# Patient Record
Sex: Female | Born: 1950 | Race: White | Hispanic: No | Marital: Married | Smoking: Never smoker
Health system: Southern US, Community
[De-identification: ages and names within clinical notes are randomized; demographics above are authoritative.]

## PROBLEM LIST (undated history)

## (undated) DIAGNOSIS — G629 Polyneuropathy, unspecified: Secondary | ICD-10-CM

## (undated) DIAGNOSIS — Z9221 Personal history of antineoplastic chemotherapy: Secondary | ICD-10-CM

## (undated) DIAGNOSIS — I071 Rheumatic tricuspid insufficiency: Secondary | ICD-10-CM

## (undated) DIAGNOSIS — M1990 Unspecified osteoarthritis, unspecified site: Secondary | ICD-10-CM

## (undated) DIAGNOSIS — F0390 Unspecified dementia without behavioral disturbance: Secondary | ICD-10-CM

## (undated) DIAGNOSIS — G2581 Restless legs syndrome: Secondary | ICD-10-CM

## (undated) DIAGNOSIS — J45909 Unspecified asthma, uncomplicated: Secondary | ICD-10-CM

## (undated) DIAGNOSIS — I340 Nonrheumatic mitral (valve) insufficiency: Secondary | ICD-10-CM

## (undated) DIAGNOSIS — E785 Hyperlipidemia, unspecified: Secondary | ICD-10-CM

## (undated) DIAGNOSIS — I10 Essential (primary) hypertension: Secondary | ICD-10-CM

## (undated) DIAGNOSIS — M5136 Other intervertebral disc degeneration, lumbar region: Secondary | ICD-10-CM

## (undated) DIAGNOSIS — C189 Malignant neoplasm of colon, unspecified: Secondary | ICD-10-CM

## (undated) HISTORY — PX: HAND SURGERY: SHX662

## (undated) HISTORY — PX: ABDOMINAL HYSTERECTOMY: SHX81

## (undated) HISTORY — PX: ROTATOR CUFF REPAIR: SHX139

## (undated) HISTORY — PX: HAMMER TOE SURGERY: SHX385

## (undated) HISTORY — PX: COLON RESECTION: SHX5231

## (undated) HISTORY — DX: Hyperlipidemia, unspecified: E78.5

## (undated) HISTORY — PX: BREAST CYST ASPIRATION: SHX578

## (undated) HISTORY — DX: Essential (primary) hypertension: I10

---

## 2004-04-05 ENCOUNTER — Ambulatory Visit: Payer: Self-pay | Admitting: Obstetrics and Gynecology

## 2005-02-21 ENCOUNTER — Ambulatory Visit: Payer: Self-pay | Admitting: Obstetrics and Gynecology

## 2006-03-06 ENCOUNTER — Ambulatory Visit: Payer: Self-pay | Admitting: Obstetrics and Gynecology

## 2006-09-19 ENCOUNTER — Encounter: Payer: Self-pay | Admitting: General Practice

## 2006-09-29 ENCOUNTER — Encounter: Payer: Self-pay | Admitting: General Practice

## 2006-10-29 ENCOUNTER — Encounter: Payer: Self-pay | Admitting: General Practice

## 2007-03-25 ENCOUNTER — Ambulatory Visit: Payer: Self-pay | Admitting: Obstetrics and Gynecology

## 2008-04-04 ENCOUNTER — Ambulatory Visit: Payer: Self-pay | Admitting: Obstetrics and Gynecology

## 2009-04-06 ENCOUNTER — Ambulatory Visit: Payer: Self-pay | Admitting: Obstetrics and Gynecology

## 2009-08-14 ENCOUNTER — Ambulatory Visit: Payer: Self-pay | Admitting: General Practice

## 2009-08-18 ENCOUNTER — Ambulatory Visit: Payer: Self-pay | Admitting: General Practice

## 2009-10-16 ENCOUNTER — Encounter: Payer: Self-pay | Admitting: General Practice

## 2009-10-28 ENCOUNTER — Encounter: Payer: Self-pay | Admitting: General Practice

## 2010-04-26 ENCOUNTER — Ambulatory Visit: Payer: Self-pay | Admitting: Obstetrics and Gynecology

## 2011-03-27 ENCOUNTER — Emergency Department: Payer: Self-pay

## 2011-03-27 LAB — CBC
MCH: 30.4 pg (ref 26.0–34.0)
MCHC: 33.2 g/dL (ref 32.0–36.0)
MCV: 92 fL (ref 80–100)
Platelet: 315 10*3/uL (ref 150–440)
RBC: 3.81 10*6/uL (ref 3.80–5.20)
RDW: 13.9 % (ref 11.5–14.5)

## 2011-03-27 LAB — BASIC METABOLIC PANEL
Anion Gap: 10 (ref 7–16)
BUN: 21 mg/dL — ABNORMAL HIGH (ref 7–18)
Chloride: 105 mmol/L (ref 98–107)
Co2: 28 mmol/L (ref 21–32)
Creatinine: 0.88 mg/dL (ref 0.60–1.30)
EGFR (African American): >60
EGFR (Non-African Amer.): >60
Sodium: 143 mmol/L (ref 136–145)

## 2011-03-27 LAB — TROPONIN I: Troponin-I: <0.02 ng/mL

## 2011-05-01 ENCOUNTER — Ambulatory Visit: Payer: Self-pay | Admitting: Obstetrics and Gynecology

## 2012-05-05 ENCOUNTER — Ambulatory Visit: Payer: Self-pay | Admitting: Obstetrics and Gynecology

## 2013-01-28 DIAGNOSIS — C189 Malignant neoplasm of colon, unspecified: Secondary | ICD-10-CM

## 2013-01-28 HISTORY — DX: Malignant neoplasm of colon, unspecified: C18.9

## 2013-02-28 ENCOUNTER — Ambulatory Visit: Payer: Self-pay | Admitting: Internal Medicine

## 2013-03-10 ENCOUNTER — Inpatient Hospital Stay: Payer: Self-pay | Admitting: Internal Medicine

## 2013-03-10 LAB — CBC
HCT: 26.4 % — ABNORMAL LOW (ref 35.0–47.0)
HGB: 8.5 g/dL — AB (ref 12.0–16.0)
MCH: 27.5 pg (ref 26.0–34.0)
MCHC: 32 g/dL (ref 32.0–36.0)
MCV: 86 fL (ref 80–100)
Platelet: 455 10*3/uL — ABNORMAL HIGH (ref 150–440)
RBC: 3.08 10*6/uL — AB (ref 3.80–5.20)
RDW: 14.7 % — AB (ref 11.5–14.5)
WBC: 12.5 10*3/uL — ABNORMAL HIGH (ref 3.6–11.0)

## 2013-03-10 LAB — TROPONIN I
Troponin-I: <0.02 ng/mL
Troponin-I: <0.02 ng/mL

## 2013-03-10 LAB — COMPREHENSIVE METABOLIC PANEL
ALK PHOS: 41 U/L — AB
Albumin: 2.8 g/dL — ABNORMAL LOW (ref 3.4–5.0)
Anion Gap: 4 — ABNORMAL LOW (ref 7–16)
BUN: 15 mg/dL (ref 7–18)
Bilirubin,Total: 0.3 mg/dL (ref 0.2–1.0)
CALCIUM: 8.8 mg/dL (ref 8.5–10.1)
CHLORIDE: 103 mmol/L (ref 98–107)
CO2: 27 mmol/L (ref 21–32)
Creatinine: 0.76 mg/dL (ref 0.60–1.30)
EGFR (African American): >60
Glucose: 113 mg/dL — ABNORMAL HIGH (ref 65–99)
OSMOLALITY: 270 (ref 275–301)
Potassium: 3.4 mmol/L — ABNORMAL LOW (ref 3.5–5.1)
SGOT(AST): 46 U/L — ABNORMAL HIGH (ref 15–37)
SGPT (ALT): 12 U/L (ref 12–78)
SODIUM: 134 mmol/L — AB (ref 136–145)
Total Protein: 7 g/dL (ref 6.4–8.2)

## 2013-03-10 LAB — IRON AND TIBC
IRON BIND. CAP.(TOTAL): 406 ug/dL (ref 250–450)
IRON SATURATION: 5 %
Iron: 20 ug/dL — ABNORMAL LOW (ref 50–170)
Unbound Iron-Bind.Cap.: 386 ug/dL

## 2013-03-10 LAB — RETICULOCYTES
Absolute Retic Count: 0.0791 10*6/uL (ref 0.019–0.186)
Reticulocyte: 2.58 % (ref 0.4–3.1)

## 2013-03-10 LAB — CK: CK, TOTAL: 116 U/L

## 2013-03-10 LAB — FERRITIN: Ferritin (ARMC): 18 ng/mL (ref 8–388)

## 2013-03-11 LAB — CBC WITH DIFFERENTIAL/PLATELET
BASOS PCT: 0.4 %
Basophil #: 0 10*3/uL (ref 0.0–0.1)
EOS ABS: 0.1 10*3/uL (ref 0.0–0.7)
EOS PCT: 1.5 %
HCT: 21.5 % — ABNORMAL LOW (ref 35.0–47.0)
HGB: 6.9 g/dL — ABNORMAL LOW (ref 12.0–16.0)
Lymphocyte #: 0.8 10*3/uL — ABNORMAL LOW (ref 1.0–3.6)
Lymphocyte %: 9.7 %
MCH: 27.4 pg (ref 26.0–34.0)
MCHC: 32.2 g/dL (ref 32.0–36.0)
MCV: 85 fL (ref 80–100)
MONO ABS: 0.6 x10 3/mm (ref 0.2–0.9)
MONOS PCT: 6.6 %
Neutrophil #: 6.8 10*3/uL — ABNORMAL HIGH (ref 1.4–6.5)
Neutrophil %: 81.8 %
Platelet: 395 10*3/uL (ref 150–440)
RBC: 2.52 10*6/uL — ABNORMAL LOW (ref 3.80–5.20)
RDW: 14.6 % — AB (ref 11.5–14.5)
WBC: 8.4 10*3/uL (ref 3.6–11.0)

## 2013-03-11 LAB — DRUG SCREEN, URINE
Amphetamines, Ur Screen: NEGATIVE (ref ?–1000)
BARBITURATES, UR SCREEN: NEGATIVE (ref ?–200)
Benzodiazepine, Ur Scrn: NEGATIVE (ref ?–200)
Cannabinoid 50 Ng, Ur ~~LOC~~: NEGATIVE (ref ?–50)
Cocaine Metabolite,Ur ~~LOC~~: NEGATIVE (ref ?–300)
MDMA (Ecstasy)Ur Screen: NEGATIVE (ref ?–500)
METHADONE, UR SCREEN: NEGATIVE (ref ?–300)
Opiate, Ur Screen: POSITIVE (ref ?–300)
Phencyclidine (PCP) Ur S: NEGATIVE (ref ?–25)
Tricyclic, Ur Screen: NEGATIVE (ref ?–1000)

## 2013-03-11 LAB — URINALYSIS, COMPLETE
BLOOD: NEGATIVE
Bilirubin,UR: NEGATIVE
Glucose,UR: NEGATIVE mg/dL (ref 0–75)
Ketone: NEGATIVE
NITRITE: NEGATIVE
PROTEIN: NEGATIVE
Ph: 6 (ref 4.5–8.0)
RBC,UR: 2 /HPF (ref 0–5)
Specific Gravity: 1.006 (ref 1.003–1.030)
Squamous Epithelial: 3
Transitional Epi: 1
WBC UR: 27 /HPF (ref 0–5)

## 2013-03-11 LAB — BASIC METABOLIC PANEL
Anion Gap: 5 — ABNORMAL LOW (ref 7–16)
BUN: 11 mg/dL (ref 7–18)
CHLORIDE: 102 mmol/L (ref 98–107)
CO2: 27 mmol/L (ref 21–32)
CREATININE: 0.68 mg/dL (ref 0.60–1.30)
Calcium, Total: 8.2 mg/dL — ABNORMAL LOW (ref 8.5–10.1)
EGFR (African American): >60
Glucose: 88 mg/dL (ref 65–99)
Osmolality: 267 (ref 275–301)
Potassium: 3.6 mmol/L (ref 3.5–5.1)
Sodium: 134 mmol/L — ABNORMAL LOW (ref 136–145)

## 2013-03-11 LAB — HEMOGLOBIN
HGB: 7.2 g/dL — ABNORMAL LOW (ref 12.0–16.0)
HGB: 8.3 g/dL — AB (ref 12.0–16.0)

## 2013-03-11 LAB — LIPID PANEL
Cholesterol: 95 mg/dL (ref 0–200)
HDL Cholesterol: 29 mg/dL — ABNORMAL LOW (ref 40–60)
LDL CHOLESTEROL, CALC: 48 mg/dL (ref 0–100)
Triglycerides: 91 mg/dL (ref 0–200)
VLDL Cholesterol, Calc: 18 mg/dL (ref 5–40)

## 2013-03-11 LAB — CA 125: CA 125: 80.9 U/mL — ABNORMAL HIGH (ref 0.0–34.0)

## 2013-03-12 LAB — HEMOGLOBIN
HGB: 8.4 g/dL — ABNORMAL LOW (ref 12.0–16.0)
HGB: 8.2 g/dL — AB (ref 12.0–16.0)

## 2013-03-12 LAB — CEA: CEA: 141.6 ng/mL — AB (ref 0.0–4.7)

## 2013-03-13 DIAGNOSIS — M1990 Unspecified osteoarthritis, unspecified site: Secondary | ICD-10-CM | POA: Insufficient documentation

## 2013-03-28 ENCOUNTER — Ambulatory Visit: Payer: Self-pay | Admitting: Internal Medicine

## 2013-04-09 DIAGNOSIS — D649 Anemia, unspecified: Secondary | ICD-10-CM | POA: Insufficient documentation

## 2013-04-09 DIAGNOSIS — C189 Malignant neoplasm of colon, unspecified: Secondary | ICD-10-CM | POA: Insufficient documentation

## 2013-04-09 DIAGNOSIS — K219 Gastro-esophageal reflux disease without esophagitis: Secondary | ICD-10-CM | POA: Insufficient documentation

## 2013-04-09 DIAGNOSIS — G610 Guillain-Barre syndrome: Secondary | ICD-10-CM | POA: Insufficient documentation

## 2013-04-09 DIAGNOSIS — M5136 Other intervertebral disc degeneration, lumbar region: Secondary | ICD-10-CM | POA: Insufficient documentation

## 2013-04-09 DIAGNOSIS — M51369 Other intervertebral disc degeneration, lumbar region without mention of lumbar back pain or lower extremity pain: Secondary | ICD-10-CM | POA: Insufficient documentation

## 2014-04-21 ENCOUNTER — Ambulatory Visit: Payer: Self-pay | Admitting: Family Medicine

## 2014-04-21 LAB — LIPID PANEL
Cholesterol: 170 mg/dL (ref 0–200)
HDL: 63 mg/dL (ref 35–70)
LDL CALC: 75 mg/dL
Triglycerides: 161 mg/dL — AB (ref 40–160)

## 2014-04-21 LAB — BASIC METABOLIC PANEL
BUN: 17 mg/dL (ref 4–21)
Creatinine: 0.6 mg/dL (ref <=1.1)
GLUCOSE: 89 mg/dL
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
## 2014-05-21 NOTE — Consult Note (Signed)
Brief Consult Note: Diagnosis: pelvic mass, likely gyn origin, possible sarcoma, iron deficient, possible colon involvement.   Patient was seen by consultant.   Discussed with Attending MD.   Comments: Patient seen, chart reviewed, discused with medicine and GYN ONCOLOGY. Appreciate surgery and GI note. NO PARACENTESIS. iNITIAL TX WILL BE SURGICAL.  Plan to watch serial hgb.. Plan best management will be to transfer to medical center, [place], WILL LOOK AT [place].   NOTE TOOK NSAID.Marland KitchenMELOXICAM, LAST DOSE ON 2/11 See dictated note....  Electronic Signatures: Dallas Schimke (MD)  (Signed 12-Feb-15 19:11)  Authored: Brief Consult Note   Last Updated: 12-Feb-15 19:11 by Dallas Schimke (MD)

## 2014-05-21 NOTE — Consult Note (Signed)
Pt seen and examined. Please see Dawn Harrrison's notes. Pt with large pelvic mass with ascites. Possible hemoperitoneum. Dropping hgb. Pt does have heme positive stool. Oncology/surgery/gyn oncology involved.  Would prefer NOT to do any colonoscopy at this point until patient goes to surgery due to increased risk of complications. However, willing to do colonoscopy to determine tumor involvement of colon from pelvic mass ONLY IF gyn oncology strongly feels it will benefit. Will follow. Thanks.  Electronic Signatures: Verdie Shire (MD)  (Signed on 12-Feb-15 14:59)  Authored  Last Updated: 12-Feb-15 14:59 by Verdie Shire (MD)
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
## 2014-05-21 NOTE — Discharge Summary (Signed)
PATIENT NAME:  Kristin Mata, SLOMSKI MR#:  [id number] DATE OF BIRTH:  07/28/1950  DATE OF ADMISSION:  03/10/2013 DATE OF DISCHARGE:  03/12/2013  The patient's discharge summary has already been done by Dr. Darvin Neighbours on the 13th of February.    ____________________________ Hart Rochester Posey Pronto, MD sap:gb D: 03/18/2013 16:57:12 ET T: 03/18/2013 23:12:47 ET JOB#: [id number]  cc: Emberlynn Riggan A. Posey Pronto, MD, <Dictator> Ilda Basset MD ELECTRONICALLY SIGNED 03/28/2013 13:36
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
## 2014-05-21 NOTE — Consult Note (Signed)
Brief Consult Note: Diagnosis: pelvic mass.   Patient was seen by consultant.   Consult note dictated.   Discussed with Attending MD.   Comments: Suspect malig from rt tube or ovary, prob sarcoma. Abuts abd wall. Will await Dr B Miller's input but may benefit from tert center referral. Will let Dr Sabra Heck make decision after she reviews films. Doubt significant hemoperitoneum to account for hgb of 6.9. prob chronic, has been ill since November, not last week when opain worsened.  Electronic Signatures: Florene Glen (MD)  (Signed 12-Feb-15 15:26)  Authored: Brief Consult Note   Last Updated: 12-Feb-15 15:26 by Florene Glen (MD)
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
## 2014-06-07 DIAGNOSIS — E7849 Other hyperlipidemia: Secondary | ICD-10-CM | POA: Insufficient documentation

## 2014-06-07 DIAGNOSIS — J3081 Allergic rhinitis due to animal (cat) (dog) hair and dander: Secondary | ICD-10-CM | POA: Insufficient documentation

## 2014-06-07 DIAGNOSIS — J4520 Mild intermittent asthma, uncomplicated: Secondary | ICD-10-CM | POA: Insufficient documentation

## 2014-06-07 DIAGNOSIS — Z8679 Personal history of other diseases of the circulatory system: Secondary | ICD-10-CM | POA: Insufficient documentation

## 2014-08-18 ENCOUNTER — Other Ambulatory Visit: Payer: Self-pay | Admitting: Obstetrics and Gynecology

## 2014-08-18 DIAGNOSIS — Z1231 Encounter for screening mammogram for malignant neoplasm of breast: Secondary | ICD-10-CM

## 2014-08-23 ENCOUNTER — Ambulatory Visit
Admission: RE | Admit: 2014-08-23 | Discharge: 2014-08-23 | Disposition: A | Discharge status: Home / Self Care | Payer: BC Managed Care – PPO | Source: Ambulatory Visit | Attending: Obstetrics and Gynecology | Admitting: Obstetrics and Gynecology

## 2014-08-23 DIAGNOSIS — Z1231 Encounter for screening mammogram for malignant neoplasm of breast: Secondary | ICD-10-CM | POA: Diagnosis not present

## 2014-08-23 HISTORY — DX: Malignant neoplasm of colon, unspecified: C18.9

## 2014-12-03 ENCOUNTER — Other Ambulatory Visit: Payer: Self-pay | Admitting: Family Medicine

## 2014-12-14 ENCOUNTER — Other Ambulatory Visit: Payer: Self-pay | Admitting: Family Medicine

## 2014-12-15 ENCOUNTER — Encounter: Payer: Self-pay | Admitting: Family Medicine

## 2014-12-15 ENCOUNTER — Ambulatory Visit (INDEPENDENT_AMBULATORY_CARE_PROVIDER_SITE_OTHER): Payer: BC Managed Care – PPO | Admitting: Family Medicine

## 2014-12-15 VITALS — BP 130/86 | HR 64 | Ht 60.0 in | Wt 137.0 lb

## 2014-12-15 DIAGNOSIS — E784 Other hyperlipidemia: Secondary | ICD-10-CM | POA: Diagnosis not present

## 2014-12-15 DIAGNOSIS — I10 Essential (primary) hypertension: Secondary | ICD-10-CM

## 2014-12-15 DIAGNOSIS — E7849 Other hyperlipidemia: Secondary | ICD-10-CM

## 2014-12-15 DIAGNOSIS — M1990 Unspecified osteoarthritis, unspecified site: Secondary | ICD-10-CM | POA: Diagnosis not present

## 2014-12-15 MED ORDER — MELOXICAM 15 MG PO TABS: ORAL_TABLET | ORAL | Status: DC

## 2014-12-15 MED ORDER — SIMVASTATIN 20 MG PO TABS: 20.0000 mg | ORAL_TABLET | Freq: Every day | ORAL | Status: DC

## 2014-12-15 MED ORDER — FLUTICASONE PROPIONATE 50 MCG/ACT NA SUSP: 2.0000 | Freq: Every day | NASAL | Status: DC

## 2014-12-15 MED ORDER — LOSARTAN POTASSIUM-HCTZ 100-25 MG PO TABS: 1.0000 | ORAL_TABLET | Freq: Every day | ORAL | Status: DC

## 2014-12-15 NOTE — Progress Notes (Signed)
Name: Kristin Mata   MRN: [id number]    DOB: 1950/05/16   Date:12/15/2014       Progress Note  Subjective  Chief Complaint  Chief Complaint  Patient presents with  . Allergic Rhinitis   . Hypertension  . Hyperlipidemia  . Arthritis    takes Meloxicam daily for arthritis relief    Hypertension This is a chronic problem. The current episode started more than 1 year ago. The problem has been gradually improving since onset. The problem is controlled. Pertinent negatives include no anxiety, blurred vision, chest pain, headaches, malaise/fatigue, neck pain, orthopnea, palpitations, peripheral edema, PND, shortness of breath or sweats. There are no associated agents to hypertension. Risk factors for coronary artery disease include dyslipidemia. Past treatments include angiotensin blockers and diuretics. The current treatment provides moderate improvement. There are no compliance problems.  There is no history of angina, kidney disease, CAD/MI, CVA, heart failure, left ventricular hypertrophy, PVD or renovascular disease. There is no history of chronic renal disease or a hypertension causing med.  Hyperlipidemia This is a chronic problem. The current episode started more than 1 year ago. The problem is controlled. Recent lipid tests were reviewed and are normal. She has no history of chronic renal disease, diabetes, hypothyroidism, liver disease, obesity or nephrotic syndrome. There are no known factors aggravating her hyperlipidemia. Pertinent negatives include no chest pain, focal sensory loss, focal weakness, leg pain, myalgias or shortness of breath. Current antihyperlipidemic treatment includes statins. The current treatment provides mild improvement of lipids. There are no compliance problems.  Risk factors for coronary artery disease include hypertension and dyslipidemia.  Arthritis Presents for follow-up visit. She complains of pain. She reports no stiffness, joint swelling or joint  warmth. Affected location: bilateral hands. Pertinent negatives include no diarrhea, dry eyes, dry mouth, dysuria, fatigue, fever, pain at night, pain while resting, rash, Raynaud's syndrome, uveitis or weight loss. Her past medical history is significant for osteoarthritis. There is no history of chronic back pain or lupus.  Past treatments include nothing. The treatment provided no relief.    No problem-specific assessment & plan notes found for this encounter.   Past Medical History  Diagnosis Date  . Colon cancer ([place]) 2015    had chemo    Past Surgical History  Procedure Laterality Date  . Hand surgery    . Breast cyst aspiration Bilateral     neg    Family History  Problem Relation Age of Onset  . Breast cancer Sister 61    Social History   Social History  . Marital Status: Married    Spouse Name: N/A  . Number of Children: N/A  . Years of Education: N/A   Occupational History  . Not on file.   Social History Main Topics  . Smoking status: Never Smoker   . Smokeless tobacco: Not on file  . Alcohol Use: 0.0 oz/week    0 Standard drinks or equivalent per week  . Drug Use: No  . Sexual Activity: Not Currently   Other Topics Concern  . Not on file   Social History Narrative    No Known Allergies   Review of Systems  Constitutional: Negative for fever, chills, weight loss, malaise/fatigue and fatigue.  HENT: Negative for ear discharge, ear pain and sore throat.   Eyes: Negative for blurred vision.  Respiratory: Negative for cough, sputum production, shortness of breath and wheezing.   Cardiovascular: Negative for chest pain, palpitations, orthopnea, leg swelling and PND.  Gastrointestinal:  Negative for heartburn, nausea, abdominal pain, diarrhea, constipation, blood in stool and melena.  Genitourinary: Negative for dysuria, urgency, frequency and hematuria.  Musculoskeletal: Positive for arthritis. Negative for myalgias, back pain, joint pain, joint  swelling, stiffness and neck pain.  Skin: Negative for rash.  Neurological: Negative for dizziness, tingling, sensory change, focal weakness and headaches.  Endo/Heme/Allergies: Negative for environmental allergies and polydipsia. Does not bruise/bleed easily.  Psychiatric/Behavioral: Negative for depression and suicidal ideas. The patient is not nervous/anxious and does not have insomnia.      Objective  Filed Vitals:   12/15/14 1459  BP: 130/86  Pulse: 64  Height: 5' (1.524 m)  Weight: 137 lb (62.143 kg)    Physical Exam  Constitutional: She is well-developed, well-nourished, and in no distress. No distress.  HENT:  Head: Normocephalic and atraumatic.  Right Ear: External ear normal.  Left Ear: External ear normal.  Nose: Nose normal.  Mouth/Throat: Oropharynx is clear and moist.  Eyes: Conjunctivae and EOM are normal. Pupils are equal, round, and reactive to light. Right eye exhibits no discharge. Left eye exhibits no discharge.  Neck: Normal range of motion. Neck supple. No JVD present. No thyromegaly present.  Cardiovascular: Normal rate, regular rhythm, normal heart sounds and intact distal pulses.  Exam reveals no gallop and no friction rub.   No murmur heard. Pulmonary/Chest: Effort normal and breath sounds normal.  Abdominal: Soft. Bowel sounds are normal. She exhibits no mass. There is no tenderness. There is no guarding.  Musculoskeletal: Normal range of motion. She exhibits no edema.  Lymphadenopathy:    She has no cervical adenopathy.  Neurological: She is alert. She has normal reflexes.  Skin: Skin is warm and dry. She is not diaphoretic.  Psychiatric: Mood and affect normal.  Nursing note and vitals reviewed.     Assessment & Plan  Problem List Items Addressed This Visit      Other   Familial multiple lipoprotein-type hyperlipidemia   Relevant Medications   losartan-hydrochlorothiazide (HYZAAR) 100-25 MG tablet   simvastatin (ZOCOR) 20 MG tablet    Other Relevant Orders   Lipid Profile    Other Visit Diagnoses    Essential hypertension    -  Primary    Relevant Medications    losartan-hydrochlorothiazide (HYZAAR) 100-25 MG tablet    simvastatin (ZOCOR) 20 MG tablet    Other Relevant Orders    Renal Function Panel    Osteoarthritis, unspecified osteoarthritis type, unspecified site        Relevant Medications    meloxicam (MOBIC) 15 MG tablet         Dr. Dierks [facility]  12/15/2014

## 2014-12-16 LAB — RENAL FUNCTION PANEL
Albumin: 4.3 g/dL (ref 3.6–4.8)
BUN / CREAT RATIO: 24 (ref 11–26)
BUN: 16 mg/dL (ref 8–27)
CO2: 29 mmol/L (ref 18–29)
CREATININE: 0.68 mg/dL (ref 0.57–1.00)
Calcium: 9.5 mg/dL (ref 8.7–10.3)
Chloride: 99 mmol/L (ref 97–106)
GFR, EST AFRICAN AMERICAN: 108 mL/min/{1.73_m2} (ref >59)
GFR, EST NON AFRICAN AMERICAN: 94 mL/min/{1.73_m2} (ref >59)
GLUCOSE: 80 mg/dL (ref 65–99)
PHOSPHORUS: 3.6 mg/dL (ref 2.5–4.5)
POTASSIUM: 3.7 mmol/L (ref 3.5–5.2)
SODIUM: 141 mmol/L (ref 136–144)

## 2014-12-16 LAB — LIPID PANEL
CHOL/HDL RATIO: 2.9 ratio (ref 0.0–4.4)
Cholesterol, Total: 170 mg/dL (ref 100–199)
HDL: 58 mg/dL (ref >39)
LDL CALC: 77 mg/dL (ref 0–99)
TRIGLYCERIDES: 175 mg/dL — AB (ref 0–149)
VLDL Cholesterol Cal: 35 mg/dL (ref 5–40)

## 2014-12-25 ENCOUNTER — Other Ambulatory Visit: Payer: Self-pay | Admitting: Family Medicine

## 2015-01-02 ENCOUNTER — Other Ambulatory Visit: Payer: Self-pay | Admitting: Family Medicine

## 2015-06-09 ENCOUNTER — Encounter: Payer: Self-pay | Admitting: Family Medicine

## 2015-06-09 ENCOUNTER — Ambulatory Visit (INDEPENDENT_AMBULATORY_CARE_PROVIDER_SITE_OTHER): Payer: BC Managed Care – PPO | Admitting: Family Medicine

## 2015-06-09 VITALS — BP 138/80 | HR 60 | Ht 60.0 in | Wt 146.0 lb

## 2015-06-09 DIAGNOSIS — J4520 Mild intermittent asthma, uncomplicated: Secondary | ICD-10-CM

## 2015-06-09 DIAGNOSIS — Z8679 Personal history of other diseases of the circulatory system: Secondary | ICD-10-CM | POA: Diagnosis not present

## 2015-06-09 DIAGNOSIS — E7849 Other hyperlipidemia: Secondary | ICD-10-CM

## 2015-06-09 DIAGNOSIS — M1990 Unspecified osteoarthritis, unspecified site: Secondary | ICD-10-CM | POA: Diagnosis not present

## 2015-06-09 DIAGNOSIS — E784 Other hyperlipidemia: Secondary | ICD-10-CM

## 2015-06-09 MED ORDER — SIMVASTATIN 20 MG PO TABS: 20.0000 mg | ORAL_TABLET | Freq: Every day | ORAL | Status: DC

## 2015-06-09 MED ORDER — FLUTICASONE-SALMETEROL 250-50 MCG/DOSE IN AEPB: 1.0000 | INHALATION_SPRAY | Freq: Every day | RESPIRATORY_TRACT | Status: DC

## 2015-06-09 MED ORDER — MELOXICAM 15 MG PO TABS: ORAL_TABLET | ORAL | Status: DC

## 2015-06-09 MED ORDER — LOSARTAN POTASSIUM-HCTZ 100-25 MG PO TABS: 1.0000 | ORAL_TABLET | Freq: Every day | ORAL | Status: DC

## 2015-06-09 NOTE — Progress Notes (Signed)
Name: Kristin Mata   MRN: [id number]    DOB: [birth year]   Date:06/09/2015       Progress Note  Subjective  Chief Complaint  Chief Complaint  Patient presents with  . Hypertension  . Hyperlipidemia  . Asthma  . Arthritis    Hypertension This is a chronic problem. The current episode started more than 1 year ago. The problem has been gradually improving since onset. The problem is controlled. Pertinent negatives include no anxiety, blurred vision, chest pain, headaches, malaise/fatigue, neck pain, orthopnea, palpitations, peripheral edema, PND, shortness of breath or sweats. There are no associated agents to hypertension. Risk factors for coronary artery disease include dyslipidemia. Past treatments include angiotensin blockers and diuretics. The current treatment provides moderate improvement. There are no compliance problems.  There is no history of angina, kidney disease, CAD/MI, CVA, heart failure, left ventricular hypertrophy, PVD, renovascular disease or retinopathy. There is no history of chronic renal disease or a hypertension causing med.  Hyperlipidemia This is a chronic problem. The current episode started more than 1 year ago. The problem is controlled. Recent lipid tests were reviewed and are normal. She has no history of chronic renal disease, diabetes, hypothyroidism, liver disease, obesity or nephrotic syndrome. Factors aggravating her hyperlipidemia include thiazides. Pertinent negatives include no chest pain, focal sensory loss, focal weakness, leg pain, myalgias or shortness of breath. Current antihyperlipidemic treatment includes statins. The current treatment provides mild improvement of lipids. There are no compliance problems.  Risk factors for coronary artery disease include dyslipidemia and hypertension.  Asthma There is no chest tightness, cough, difficulty breathing, frequent throat clearing, shortness of breath, sputum production or wheezing. This is a recurrent  problem. The current episode started more than 1 year ago. The problem has been waxing and waning. Pertinent negatives include no chest pain, ear pain, fever, headaches, heartburn, malaise/fatigue, myalgias, PND, sore throat, sweats or weight loss. She reports moderate improvement on treatment. Her symptoms are not alleviated by beta-agonist. Her past medical history is significant for asthma.  Arthritis Presents for follow-up visit. She reports no pain, stiffness, joint swelling or joint warmth. Pertinent negatives include no diarrhea, dysuria, fever, rash or weight loss.    No problem-specific assessment & plan notes found for this encounter.   Past Medical History  Diagnosis Date  . Colon cancer ([place]) 2015    had chemo  . Hypertension   . Hyperlipidemia     Past Surgical History  Procedure Laterality Date  . Hand surgery    . Breast cyst aspiration Bilateral     neg    Family History  Problem Relation Age of Onset  . Breast cancer Sister 13    Social History   Social History  . Marital Status: Married    Spouse Name: N/A  . Number of Children: N/A  . Years of Education: N/A   Occupational History  . Not on file.   Social History Main Topics  . Smoking status: Never Smoker   . Smokeless tobacco: Not on file  . Alcohol Use: 0.0 oz/week    0 Standard drinks or equivalent per week  . Drug Use: No  . Sexual Activity: Not Currently   Other Topics Concern  . Not on file   Social History Narrative    No Known Allergies   Review of Systems  Constitutional: Negative for fever, chills, weight loss and malaise/fatigue.  HENT: Negative for ear discharge, ear pain and sore throat.   Eyes: Negative for blurred  vision.  Respiratory: Negative for cough, sputum production, shortness of breath and wheezing.   Cardiovascular: Negative for chest pain, palpitations, orthopnea, leg swelling and PND.  Gastrointestinal: Negative for heartburn, nausea, abdominal pain, diarrhea,  constipation, blood in stool and melena.  Genitourinary: Negative for dysuria, urgency, frequency and hematuria.  Musculoskeletal: Positive for arthritis. Negative for myalgias, back pain, joint pain, joint swelling, stiffness and neck pain.  Skin: Negative for rash.  Neurological: Negative for dizziness, tingling, sensory change, focal weakness and headaches.  Endo/Heme/Allergies: Negative for environmental allergies and polydipsia. Does not bruise/bleed easily.  Psychiatric/Behavioral: Negative for depression and suicidal ideas. The patient is not nervous/anxious and does not have insomnia.      Objective  Filed Vitals:   06/09/15 0901  BP: 138/80  Pulse: 60  Height: 5' (1.524 m)  Weight: 146 lb (66.225 kg)    Physical Exam  Constitutional: She is well-developed, well-nourished, and in no distress. No distress.  HENT:  Head: Normocephalic and atraumatic.  Right Ear: External ear normal.  Left Ear: External ear normal.  Nose: Nose normal.  Mouth/Throat: Oropharynx is clear and moist.  Eyes: Conjunctivae and EOM are normal. Pupils are equal, round, and reactive to light. Right eye exhibits no discharge. Left eye exhibits no discharge.  Neck: Normal range of motion. Neck supple. No JVD present. No thyromegaly present.  Cardiovascular: Normal rate, regular rhythm, normal heart sounds and intact distal pulses.  Exam reveals no gallop and no friction rub.   No murmur heard. Pulmonary/Chest: Effort normal and breath sounds normal.  Abdominal: Soft. Bowel sounds are normal. She exhibits no mass. There is no tenderness. There is no guarding.  Musculoskeletal: Normal range of motion. She exhibits no edema.  Lymphadenopathy:    She has no cervical adenopathy.  Neurological: She is alert. She has normal reflexes.  Skin: Skin is warm and dry. She is not diaphoretic.  Psychiatric: Mood and affect normal.  Nursing note and vitals reviewed.     Assessment & Plan  Problem List Items  Addressed This Visit      Respiratory   Asthma, mild intermittent   Relevant Medications   Fluticasone-Salmeterol (ADVAIR) 250-50 MCG/DOSE AEPB     Other   Familial multiple lipoprotein-type hyperlipidemia   Relevant Medications   losartan-hydrochlorothiazide (HYZAAR) 100-25 MG tablet   simvastatin (ZOCOR) 20 MG tablet   Other Relevant Orders   Lipid Profile   H/O: HTN (hypertension) - Primary   Relevant Medications   losartan-hydrochlorothiazide (HYZAAR) 100-25 MG tablet   Other Relevant Orders   Renal Function Panel    Other Visit Diagnoses    Arthritis        Relevant Medications    meloxicam (MOBIC) 15 MG tablet         Dr. Deanna Jones [facility]  06/09/2015

## 2015-06-10 LAB — LIPID PANEL
CHOL/HDL RATIO: 2.8 ratio (ref 0.0–4.4)
Cholesterol, Total: 168 mg/dL (ref 100–199)
HDL: 60 mg/dL (ref >39)
LDL Calculated: 86 mg/dL (ref 0–99)
Triglycerides: 109 mg/dL (ref 0–149)
VLDL Cholesterol Cal: 22 mg/dL (ref 5–40)

## 2015-06-10 LAB — RENAL FUNCTION PANEL
Albumin: 4.3 g/dL (ref 3.6–4.8)
BUN / CREAT RATIO: 24 (ref 12–28)
BUN: 15 mg/dL (ref 8–27)
CALCIUM: 9.3 mg/dL (ref 8.7–10.3)
CHLORIDE: 100 mmol/L (ref 96–106)
CO2: 26 mmol/L (ref 18–29)
Creatinine, Ser: 0.62 mg/dL (ref 0.57–1.00)
GFR, EST AFRICAN AMERICAN: 112 mL/min/{1.73_m2} (ref >59)
GFR, EST NON AFRICAN AMERICAN: 97 mL/min/{1.73_m2} (ref >59)
Glucose: 83 mg/dL (ref 65–99)
PHOSPHORUS: 2.9 mg/dL (ref 2.5–4.5)
POTASSIUM: 3.9 mmol/L (ref 3.5–5.2)
SODIUM: 141 mmol/L (ref 134–144)

## 2015-07-18 ENCOUNTER — Encounter: Payer: Self-pay | Admitting: Family Medicine

## 2015-07-18 ENCOUNTER — Ambulatory Visit (INDEPENDENT_AMBULATORY_CARE_PROVIDER_SITE_OTHER): Payer: BC Managed Care – PPO | Admitting: Family Medicine

## 2015-07-18 VITALS — BP 124/70 | HR 76 | Ht 60.0 in | Wt 147.0 lb

## 2015-07-18 DIAGNOSIS — J0100 Acute maxillary sinusitis, unspecified: Secondary | ICD-10-CM

## 2015-07-18 MED ORDER — AZITHROMYCIN 250 MG PO TABS: ORAL_TABLET | ORAL | Status: DC

## 2015-07-18 NOTE — Progress Notes (Signed)
Name: Kristin Mata   MRN: [id number]    DOB: September 13, 1950   Date:07/18/2015       Progress Note  Subjective  Chief Complaint  Chief Complaint  Patient presents with  . Sinusitis    cough and cong- no production, nasal drainage    Sinusitis This is a new problem. The current episode started in the past 7 days. The problem has been waxing and waning since onset. There has been no fever. Associated symptoms include congestion, coughing, diaphoresis, sinus pressure and sneezing. Pertinent negatives include no chills, ear pain, headaches, hoarse voice, neck pain, shortness of breath, sore throat or swollen glands. Past treatments include acetaminophen and oral decongestants. The treatment provided no relief.    No problem-specific assessment & plan notes found for this encounter.   Past Medical History  Diagnosis Date  . Colon cancer ([place]) 2015    had chemo  . Hypertension   . Hyperlipidemia     Past Surgical History  Procedure Laterality Date  . Hand surgery    . Breast cyst aspiration Bilateral     neg    Family History  Problem Relation Age of Onset  . Breast cancer Sister 55    Social History   Social History  . Marital Status: Married    Spouse Name: N/A  . Number of Children: N/A  . Years of Education: N/A   Occupational History  . Not on file.   Social History Main Topics  . Smoking status: Never Smoker   . Smokeless tobacco: Not on file  . Alcohol Use: 0.0 oz/week    0 Standard drinks or equivalent per week  . Drug Use: No  . Sexual Activity: Not Currently   Other Topics Concern  . Not on file   Social History Narrative    No Known Allergies   Review of Systems  Constitutional: Positive for diaphoresis. Negative for fever, chills, weight loss and malaise/fatigue.  HENT: Positive for congestion, sinus pressure and sneezing. Negative for ear discharge, ear pain, hoarse voice and sore throat.   Eyes: Negative for blurred vision.  Respiratory:  Positive for cough. Negative for sputum production, shortness of breath and wheezing.   Cardiovascular: Negative for chest pain, palpitations and leg swelling.  Gastrointestinal: Negative for heartburn, nausea, abdominal pain, diarrhea, constipation, blood in stool and melena.  Genitourinary: Negative for dysuria, urgency, frequency and hematuria.  Musculoskeletal: Negative for myalgias, back pain, joint pain and neck pain.  Skin: Negative for rash.  Neurological: Negative for dizziness, tingling, sensory change, focal weakness and headaches.  Endo/Heme/Allergies: Negative for environmental allergies and polydipsia. Does not bruise/bleed easily.  Psychiatric/Behavioral: Negative for depression and suicidal ideas. The patient is not nervous/anxious and does not have insomnia.      Objective  Filed Vitals:   07/18/15 1506  BP: 124/70  Pulse: 76  Height: 5' (1.524 m)  Weight: 147 lb (66.679 kg)    Physical Exam  Constitutional: She is well-developed, well-nourished, and in no distress. No distress.  HENT:  Head: Normocephalic and atraumatic.  Right Ear: External ear normal.  Left Ear: External ear normal.  Nose: Nose normal.  Mouth/Throat: Oropharynx is clear and moist.  Eyes: Conjunctivae and EOM are normal. Pupils are equal, round, and reactive to light. Right eye exhibits no discharge. Left eye exhibits no discharge.  Neck: Normal range of motion. Neck supple. No JVD present. No thyromegaly present.  Cardiovascular: Normal rate, regular rhythm, normal heart sounds and intact distal pulses.  Exam reveals no gallop and no friction rub.   No murmur heard. Pulmonary/Chest: Effort normal and breath sounds normal.  Abdominal: Soft. Bowel sounds are normal. She exhibits no mass. There is no tenderness. There is no guarding.  Musculoskeletal: Normal range of motion. She exhibits no edema.  Lymphadenopathy:    She has no cervical adenopathy.  Neurological: She is alert. She has normal  reflexes.  Skin: Skin is warm and dry. She is not diaphoretic.  Psychiatric: Mood and affect normal.  Nursing note and vitals reviewed.     Assessment & Plan  Problem List Items Addressed This Visit    None    Visit Diagnoses    Acute maxillary sinusitis, recurrence not specified    -  Primary    Relevant Medications    azithromycin (ZITHROMAX) 250 MG tablet         Dr. [facility]  07/18/2015

## 2015-09-12 ENCOUNTER — Other Ambulatory Visit: Payer: Self-pay | Admitting: Family Medicine

## 2015-09-12 ENCOUNTER — Other Ambulatory Visit: Payer: Self-pay | Admitting: Obstetrics and Gynecology

## 2015-09-12 DIAGNOSIS — Z1231 Encounter for screening mammogram for malignant neoplasm of breast: Secondary | ICD-10-CM

## 2015-09-18 ENCOUNTER — Ambulatory Visit
Admission: RE | Admit: 2015-09-18 | Discharge: 2015-09-18 | Disposition: A | Discharge status: Home / Self Care | Payer: BC Managed Care – PPO | Source: Ambulatory Visit | Attending: Obstetrics and Gynecology | Admitting: Obstetrics and Gynecology

## 2015-09-18 ENCOUNTER — Other Ambulatory Visit: Payer: Self-pay | Admitting: Obstetrics and Gynecology

## 2015-09-18 DIAGNOSIS — Z1231 Encounter for screening mammogram for malignant neoplasm of breast: Secondary | ICD-10-CM | POA: Insufficient documentation

## 2015-10-13 ENCOUNTER — Other Ambulatory Visit: Payer: Self-pay | Admitting: Family Medicine

## 2015-12-07 ENCOUNTER — Other Ambulatory Visit: Payer: Self-pay | Admitting: Family Medicine

## 2015-12-07 DIAGNOSIS — Z8679 Personal history of other diseases of the circulatory system: Secondary | ICD-10-CM

## 2015-12-07 DIAGNOSIS — E7849 Other hyperlipidemia: Secondary | ICD-10-CM

## 2016-01-02 ENCOUNTER — Other Ambulatory Visit: Payer: Self-pay | Admitting: Family Medicine

## 2016-01-02 DIAGNOSIS — E7849 Other hyperlipidemia: Secondary | ICD-10-CM

## 2016-01-02 DIAGNOSIS — M1990 Unspecified osteoarthritis, unspecified site: Secondary | ICD-10-CM

## 2016-01-02 DIAGNOSIS — Z8679 Personal history of other diseases of the circulatory system: Secondary | ICD-10-CM

## 2016-01-05 ENCOUNTER — Ambulatory Visit (INDEPENDENT_AMBULATORY_CARE_PROVIDER_SITE_OTHER): Payer: BC Managed Care – PPO | Admitting: Family Medicine

## 2016-01-05 ENCOUNTER — Encounter: Payer: Self-pay | Admitting: Family Medicine

## 2016-01-05 ENCOUNTER — Other Ambulatory Visit: Payer: Self-pay | Admitting: *Deleted

## 2016-01-05 VITALS — BP 128/82 | HR 88 | Temp 97.7°F | Ht 60.0 in | Wt 141.0 lb

## 2016-01-05 DIAGNOSIS — Z8679 Personal history of other diseases of the circulatory system: Secondary | ICD-10-CM

## 2016-01-05 DIAGNOSIS — E784 Other hyperlipidemia: Secondary | ICD-10-CM | POA: Diagnosis not present

## 2016-01-05 DIAGNOSIS — M1990 Unspecified osteoarthritis, unspecified site: Secondary | ICD-10-CM | POA: Diagnosis not present

## 2016-01-05 DIAGNOSIS — E7849 Other hyperlipidemia: Secondary | ICD-10-CM

## 2016-01-05 DIAGNOSIS — Z23 Encounter for immunization: Secondary | ICD-10-CM | POA: Diagnosis not present

## 2016-01-05 MED ORDER — LOSARTAN POTASSIUM-HCTZ 100-25 MG PO TABS: 1.0000 | ORAL_TABLET | Freq: Every day | ORAL | 2 refills | Status: DC

## 2016-01-05 MED ORDER — MELOXICAM 15 MG PO TABS: 15.0000 mg | ORAL_TABLET | Freq: Every day | ORAL | 2 refills | Status: DC

## 2016-01-05 MED ORDER — SIMVASTATIN 20 MG PO TABS: 20.0000 mg | ORAL_TABLET | Freq: Every day | ORAL | 2 refills | Status: DC

## 2016-01-05 NOTE — Progress Notes (Signed)
Name: Kristin Mata   MRN: [id number]    DOB: 12-03-1950   Date:01/05/2016       Progress Note  Subjective  Chief Complaint  No chief complaint on file.   Hypertension  This is a chronic problem. The current episode started more than 1 year ago. The problem has been gradually improving since onset. The problem is controlled. Pertinent negatives include no anxiety, blurred vision, chest pain, headaches, malaise/fatigue, neck pain, orthopnea, palpitations, peripheral edema, PND, shortness of breath or sweats. There are no associated agents to hypertension. There are no known risk factors for coronary artery disease. Past treatments include ACE inhibitors and diuretics. The current treatment provides mild improvement. There are no compliance problems.  There is no history of angina, kidney disease, CAD/MI, CVA, heart failure, left ventricular hypertrophy, PVD, renovascular disease or retinopathy. There is no history of a hypertension causing med.  Hand Pain   The incident occurred more than 1 week ago. There was no injury mechanism. The pain is present in the right fingers and left fingers. The quality of the pain is described as aching. The pain is at a severity of 5/10. The pain is moderate. The pain has been fluctuating since the incident. Pertinent negatives include no chest pain or tingling. She has tried NSAIDs for the symptoms. The treatment provided moderate relief.  Hyperlipidemia  This is a chronic problem. The current episode started more than 1 year ago. The problem is controlled. Recent lipid tests were reviewed and are normal. There are no known factors aggravating her hyperlipidemia. Pertinent negatives include no chest pain, focal sensory loss, focal weakness, leg pain, myalgias or shortness of breath. Current antihyperlipidemic treatment includes statins. The current treatment provides mild improvement of lipids. There are no compliance problems.     No problem-specific Assessment  & Plan notes found for this encounter.   Past Medical History:  Diagnosis Date  . Colon cancer ([place]) 2015   had chemo  . Hyperlipidemia   . Hypertension     Past Surgical History:  Procedure Laterality Date  . BREAST CYST ASPIRATION Bilateral    neg  . HAND SURGERY      Family History  Problem Relation Age of Onset  . Breast cancer Sister 30    Social History   Social History  . Marital status: Married    Spouse name: N/A  . Number of children: N/A  . Years of education: N/A   Occupational History  . Not on file.   Social History Main Topics  . Smoking status: Never Smoker  . Smokeless tobacco: Not on file  . Alcohol use 0.0 oz/week  . Drug use: No  . Sexual activity: Not Currently   Other Topics Concern  . Not on file   Social History Narrative  . No narrative on file    No Known Allergies   Review of Systems  Constitutional: Negative for chills, fever, malaise/fatigue and weight loss.  HENT: Negative for ear discharge, ear pain and sore throat.   Eyes: Negative for blurred vision.  Respiratory: Negative for cough, sputum production, shortness of breath and wheezing.   Cardiovascular: Negative for chest pain, palpitations, orthopnea, leg swelling and PND.  Gastrointestinal: Negative for abdominal pain, blood in stool, constipation, diarrhea, heartburn, melena and nausea.  Genitourinary: Negative for dysuria, frequency, hematuria and urgency.  Musculoskeletal: Negative for back pain, joint pain, myalgias and neck pain.  Skin: Negative for rash.  Neurological: Negative for dizziness, tingling, sensory change, focal  weakness and headaches.  Endo/Heme/Allergies: Negative for environmental allergies and polydipsia. Does not bruise/bleed easily.  Psychiatric/Behavioral: Negative for depression and suicidal ideas. The patient is not nervous/anxious and does not have insomnia.      Objective  Vitals:   01/05/16 0946  BP: 128/82  Pulse: 88  Temp: 97.7  F (36.5 C)  SpO2: 98%  Weight: 141 lb (64 kg)  Height: 5' (1.524 m)    Physical Exam  Constitutional: She is well-developed, well-nourished, and in no distress. No distress.  HENT:  Head: Normocephalic and atraumatic.  Right Ear: External ear normal.  Left Ear: External ear normal.  Nose: Nose normal.  Mouth/Throat: Oropharynx is clear and moist.  Eyes: Conjunctivae and EOM are normal. Pupils are equal, round, and reactive to light. Right eye exhibits no discharge. Left eye exhibits no discharge.  Neck: Normal range of motion. Neck supple. No JVD present. No thyromegaly present.  Cardiovascular: Normal rate, regular rhythm, normal heart sounds and intact distal pulses.  Exam reveals no gallop and no friction rub.   No murmur heard. Pulmonary/Chest: Effort normal and breath sounds normal. She has no wheezes. She has no rales.  Abdominal: Soft. Bowel sounds are normal. She exhibits no mass. There is no tenderness. There is no guarding.  Musculoskeletal: Normal range of motion. She exhibits no edema.  Lymphadenopathy:    She has no cervical adenopathy.  Neurological: She is alert. She has normal reflexes.  Skin: Skin is warm and dry. She is not diaphoretic.  Psychiatric: Mood and affect normal.  Nursing note and vitals reviewed.     Assessment & Plan  Problem List Items Addressed This Visit      Other   Familial multiple lipoprotein-type hyperlipidemia   Relevant Medications   simvastatin (ZOCOR) 20 MG tablet   losartan-hydrochlorothiazide (HYZAAR) 100-25 MG tablet   H/O: HTN (hypertension)   Relevant Medications   losartan-hydrochlorothiazide (HYZAAR) 100-25 MG tablet    Other Visit Diagnoses    Need for DTaP vaccination    -  Primary   Relevant Orders   Tdap vaccine greater than or equal to 7yo IM (Completed)   Arthritis       Relevant Medications   meloxicam (MOBIC) 15 MG tablet        Dr. Lukas [facility]  01/05/16

## 2016-01-28 ENCOUNTER — Other Ambulatory Visit: Payer: Self-pay | Admitting: Family Medicine

## 2016-04-02 ENCOUNTER — Other Ambulatory Visit: Payer: Self-pay | Admitting: Family Medicine

## 2016-04-30 ENCOUNTER — Other Ambulatory Visit: Payer: Self-pay | Admitting: Family Medicine

## 2016-07-23 ENCOUNTER — Other Ambulatory Visit: Payer: Self-pay | Admitting: Family Medicine

## 2016-10-14 ENCOUNTER — Other Ambulatory Visit: Payer: Self-pay | Admitting: Family Medicine

## 2016-10-14 DIAGNOSIS — E7849 Other hyperlipidemia: Secondary | ICD-10-CM

## 2016-10-14 DIAGNOSIS — Z8679 Personal history of other diseases of the circulatory system: Secondary | ICD-10-CM

## 2016-10-14 DIAGNOSIS — M1990 Unspecified osteoarthritis, unspecified site: Secondary | ICD-10-CM

## 2016-10-29 ENCOUNTER — Ambulatory Visit: Payer: BC Managed Care – PPO | Admitting: Family Medicine

## 2016-10-31 ENCOUNTER — Encounter: Payer: Self-pay | Admitting: Family Medicine

## 2016-10-31 ENCOUNTER — Ambulatory Visit (INDEPENDENT_AMBULATORY_CARE_PROVIDER_SITE_OTHER): Payer: BC Managed Care – PPO | Admitting: Family Medicine

## 2016-10-31 VITALS — BP 120/70 | HR 68 | Ht 60.0 in | Wt 150.0 lb

## 2016-10-31 DIAGNOSIS — J45909 Unspecified asthma, uncomplicated: Secondary | ICD-10-CM | POA: Diagnosis not present

## 2016-10-31 DIAGNOSIS — E7849 Other hyperlipidemia: Secondary | ICD-10-CM | POA: Diagnosis not present

## 2016-10-31 DIAGNOSIS — Z8679 Personal history of other diseases of the circulatory system: Secondary | ICD-10-CM | POA: Diagnosis not present

## 2016-10-31 DIAGNOSIS — I10 Essential (primary) hypertension: Secondary | ICD-10-CM | POA: Diagnosis not present

## 2016-10-31 DIAGNOSIS — J069 Acute upper respiratory infection, unspecified: Secondary | ICD-10-CM

## 2016-10-31 MED ORDER — SIMVASTATIN 20 MG PO TABS: 20.0000 mg | ORAL_TABLET | Freq: Every day | ORAL | 1 refills | Status: DC

## 2016-10-31 MED ORDER — FLUTICASONE PROPIONATE 50 MCG/ACT NA SUSP: NASAL | 11 refills | Status: DC

## 2016-10-31 MED ORDER — LOSARTAN POTASSIUM-HCTZ 100-25 MG PO TABS: 1.0000 | ORAL_TABLET | Freq: Every day | ORAL | 1 refills | Status: DC

## 2016-10-31 NOTE — Progress Notes (Signed)
Name: Kristin Mata   MRN: [id number]    DOB: 07-10-1950   Date:10/31/2016       Progress Note  Subjective  Chief Complaint  Chief Complaint  Patient presents with  . Hypertension  . Hyperlipidemia  . Allergic Rhinitis     Hypertension  This is a new problem. The current episode started more than 1 year ago. The problem has been gradually improving since onset. The problem is controlled. Pertinent negatives include no anxiety, blurred vision, chest pain, headaches, malaise/fatigue, neck pain, orthopnea, palpitations, peripheral edema, PND, shortness of breath or sweats. There are no associated agents to hypertension. Risk factors for coronary artery disease include dyslipidemia. Past treatments include angiotensin blockers and diuretics. The current treatment provides moderate improvement. Compliance problems include medication side effects.  There is no history of angina, kidney disease, CAD/MI, CVA, heart failure, left ventricular hypertrophy, PVD or retinopathy. There is no history of chronic renal disease, a hypertension causing med or renovascular disease.  Hyperlipidemia  This is a new problem. The current episode started more than 1 year ago. The problem is controlled. Recent lipid tests were reviewed and are normal. She has no history of chronic renal disease, diabetes, hypothyroidism, liver disease, obesity or nephrotic syndrome. Factors aggravating her hyperlipidemia include thiazides. Pertinent negatives include no chest pain, focal sensory loss, focal weakness, leg pain, myalgias or shortness of breath. She is currently on no antihyperlipidemic treatment. The current treatment provides moderate improvement of lipids. There are no compliance problems.  Risk factors for coronary artery disease include hypertension and dyslipidemia.  URI   This is a new problem. The current episode started in the past 7 days. The problem has been waxing and waning. There has been no fever. Pertinent  negatives include no abdominal pain, chest pain, congestion, coughing, diarrhea, dysuria, ear pain, headaches, joint pain, joint swelling, nausea, neck pain, rash, rhinorrhea, sinus pain, sneezing, sore throat or wheezing.    No problem-specific Assessment & Plan notes found for this encounter.   Past Medical History:  Diagnosis Date  . Colon cancer ([place]) 2015   had chemo  . Hyperlipidemia   . Hypertension     Past Surgical History:  Procedure Laterality Date  . BREAST CYST ASPIRATION Bilateral    neg  . HAND SURGERY      Family History  Problem Relation Age of Onset  . Breast cancer Sister 12    Social History   Social History  . Marital status: Married    Spouse name: N/A  . Number of children: N/A  . Years of education: N/A   Occupational History  . Not on file.   Social History Main Topics  . Smoking status: Never Smoker  . Smokeless tobacco: Never Used  . Alcohol use 0.0 oz/week  . Drug use: No  . Sexual activity: Not Currently   Other Topics Concern  . Not on file   Social History Narrative  . No narrative on file    No Known Allergies  Outpatient Medications Prior to Visit  Medication Sig Dispense Refill  . albuterol (PROVENTIL HFA;VENTOLIN HFA) 108 (90 Base) MCG/ACT inhaler Inhale 2 puffs every 6 hours as needed    . ascorbic acid (VITAMIN C) 250 MG tablet Take 1 tablet by mouth daily.    . Biotin 10 MG TABS Take 1 tablet by mouth daily.    . Cholecalciferol (VITAMIN D PO) Take by mouth.    . fexofenadine (ALLEGRA) 180 MG tablet TAKE 1 TABLET  BY MOUTH EVERY DAY 30 tablet 0  . Fluticasone-Salmeterol (ADVAIR) 250-50 MCG/DOSE AEPB Inhale 1 puff into the lungs daily. Dr [name] 60 each 6  . gabapentin (NEURONTIN) 100 MG capsule Take 1 capsule by mouth 3 (three) times daily. Dr Burt Ek    . meloxicam (MOBIC) 15 MG tablet TAKE 1 TABLET (15 MG TOTAL) BY MOUTH DAILY. 15 tablet 0  . Omega-3 Fatty Acids (FISH OIL) 1000 MG CAPS Take 1 capsule by mouth 2  (two) times daily.    . vitamin E 1000 UNIT capsule Take 1 capsule by mouth daily.    . fluticasone (FLONASE) 50 MCG/ACT nasal spray SPRAY 2 SPRAYS INTO EACH NOSTRIL EVERY DAY 16 g 0  . losartan-hydrochlorothiazide (HYZAAR) 100-25 MG tablet TAKE 1 TABLET BY MOUTH DAILY. 15 tablet 0  . simvastatin (ZOCOR) 20 MG tablet TAKE 1 TABLET (20 MG TOTAL) BY MOUTH DAILY. 15 tablet 0  . azithromycin (ZITHROMAX) 250 MG tablet 2 today then 1 aday for 4 days 6 tablet 1   No facility-administered medications prior to visit.     Review of Systems  Constitutional: Negative for chills, fever, malaise/fatigue and weight loss.  HENT: Negative for congestion, ear discharge, ear pain, rhinorrhea, sinus pain, sneezing and sore throat.   Eyes: Negative for blurred vision.  Respiratory: Negative for cough, sputum production, shortness of breath and wheezing.   Cardiovascular: Negative for chest pain, palpitations, orthopnea, leg swelling and PND.  Gastrointestinal: Negative for abdominal pain, blood in stool, constipation, diarrhea, heartburn, melena and nausea.  Genitourinary: Negative for dysuria, frequency, hematuria and urgency.  Musculoskeletal: Negative for back pain, joint pain, myalgias and neck pain.  Skin: Negative for rash.  Neurological: Negative for dizziness, tingling, sensory change, focal weakness and headaches.  Endo/Heme/Allergies: Negative for environmental allergies and polydipsia. Does not bruise/bleed easily.  Psychiatric/Behavioral: Negative for depression and suicidal ideas. The patient is not nervous/anxious and does not have insomnia.      Objective  Vitals:   10/31/16 1429  BP: 120/70  Pulse: 68  Weight: 150 lb (68 kg)  Height: 5' (1.524 m)    Physical Exam  Constitutional: She is well-developed, well-nourished, and in no distress. No distress.  HENT:  Head: Normocephalic and atraumatic.  Right Ear: External ear normal.  Left Ear: External ear normal.  Nose: Nose normal.   Mouth/Throat: Oropharynx is clear and moist.  Eyes: Pupils are equal, round, and reactive to light. Conjunctivae and EOM are normal. Right eye exhibits no discharge. Left eye exhibits no discharge.  Neck: Normal range of motion. Neck supple. No JVD present. No thyromegaly present.  Cardiovascular: Normal rate, regular rhythm, normal heart sounds and intact distal pulses.  Exam reveals no gallop and no friction rub.   No murmur heard. Pulmonary/Chest: Effort normal and breath sounds normal. She has no wheezes. She has no rales.  Abdominal: Soft. Bowel sounds are normal. She exhibits no mass. There is no tenderness. There is no guarding.  Musculoskeletal: Normal range of motion. She exhibits no edema.  Lymphadenopathy:    She has no cervical adenopathy.  Neurological: She is alert. She has normal reflexes.  Skin: Skin is warm and dry. She is not diaphoretic.  Psychiatric: Mood and affect normal.  Nursing note and vitals reviewed.     Assessment & Plan  Problem List Items Addressed This Visit      Cardiovascular and Mediastinum   Essential hypertension   Relevant Medications   losartan-hydrochlorothiazide (HYZAAR) 100-25 MG tablet   simvastatin (ZOCOR)  20 MG tablet     Respiratory   Moderate asthma without complication - Primary     Other   Familial multiple lipoprotein-type hyperlipidemia   Relevant Medications   losartan-hydrochlorothiazide (HYZAAR) 100-25 MG tablet   simvastatin (ZOCOR) 20 MG tablet   H/O: HTN (hypertension)   Relevant Medications   losartan-hydrochlorothiazide (HYZAAR) 100-25 MG tablet    Other Visit Diagnoses    Viral upper respiratory tract infection       Relevant Medications   fluticasone (FLONASE) 50 MCG/ACT nasal spray      Meds ordered this encounter  Medications  . fluticasone (FLONASE) 50 MCG/ACT nasal spray    Sig: SPRAY 2 SPRAYS INTO EACH NOSTRIL EVERY DAY    Dispense:  16 g    Refill:  11    Not seen in 9 months- sched appt for med  refills  . losartan-hydrochlorothiazide (HYZAAR) 100-25 MG tablet    Sig: Take 1 tablet by mouth daily.    Dispense:  90 tablet    Refill:  1  . simvastatin (ZOCOR) 20 MG tablet    Sig: Take 1 tablet (20 mg total) by mouth daily.    Dispense:  90 tablet    Refill:  1    Not seen in 9 months- sched appt      Dr. [facility]  10/31/16

## 2016-11-11 ENCOUNTER — Other Ambulatory Visit: Payer: Self-pay | Admitting: Family Medicine

## 2016-11-11 DIAGNOSIS — M1990 Unspecified osteoarthritis, unspecified site: Secondary | ICD-10-CM

## 2016-11-12 ENCOUNTER — Other Ambulatory Visit: Payer: Self-pay | Admitting: Family Medicine

## 2016-12-13 ENCOUNTER — Other Ambulatory Visit: Payer: Self-pay | Admitting: Family Medicine

## 2016-12-13 DIAGNOSIS — M1990 Unspecified osteoarthritis, unspecified site: Secondary | ICD-10-CM

## 2017-01-07 ENCOUNTER — Encounter: Payer: Self-pay | Admitting: Family Medicine

## 2017-01-07 ENCOUNTER — Ambulatory Visit: Payer: BC Managed Care – PPO | Admitting: Family Medicine

## 2017-01-07 VITALS — BP 130/80 | HR 80 | Temp 101.0°F | Ht 60.0 in | Wt 148.0 lb

## 2017-01-07 DIAGNOSIS — R509 Fever, unspecified: Secondary | ICD-10-CM

## 2017-01-07 DIAGNOSIS — J0100 Acute maxillary sinusitis, unspecified: Secondary | ICD-10-CM

## 2017-01-07 LAB — POCT INFLUENZA A/B
INFLUENZA A, POC: NEGATIVE
INFLUENZA B, POC: NEGATIVE

## 2017-01-07 MED ORDER — AMOXICILLIN-POT CLAVULANATE 875-125 MG PO TABS: 1.0000 | ORAL_TABLET | Freq: Two times a day (BID) | ORAL | 0 refills | Status: DC

## 2017-01-07 NOTE — Progress Notes (Signed)
Name: Kristin Mata   MRN: [id number]    DOB: [birth year]   Date:01/07/2017       Progress Note  Subjective  Chief Complaint  Chief Complaint  Patient presents with  . Sinusitis    facial pressure, legs are achy, sore throat, cough- drainage    Sinusitis  This is a new problem. The current episode started in the past 7 days (saturday). The problem has been waxing and waning since onset. The maximum temperature recorded prior to her arrival was 101 - 101.9 F. The fever has been present for 1 to 2 days. The pain is moderate. Associated symptoms include chills, congestion, coughing, headaches, a hoarse voice, sinus pressure and a sore throat. Pertinent negatives include no diaphoresis, ear pain, neck pain, shortness of breath, sneezing or swollen glands. (Myopathy) Past treatments include antibiotics and acetaminophen.  Fever   This is a new problem. The current episode started in the past 7 days. The problem occurs 2 to 4 times per day. The problem has been waxing and waning. The maximum temperature noted was 100 to 100.9 F. Associated symptoms include congestion, coughing, headaches, muscle aches and a sore throat. Pertinent negatives include no abdominal pain, chest pain, diarrhea, ear pain, nausea, rash, sleepiness, urinary pain, vomiting or wheezing. She has tried acetaminophen and NSAIDs for the symptoms. The treatment provided no relief.  Risk factors: sick contacts     No problem-specific Assessment & Plan notes found for this encounter.   Past Medical History:  Diagnosis Date  . Colon cancer ([place]) 2015   had chemo  . Hyperlipidemia   . Hypertension     Past Surgical History:  Procedure Laterality Date  . BREAST CYST ASPIRATION Bilateral    neg  . HAND SURGERY      Family History  Problem Relation Age of Onset  . Breast cancer Sister 22    Social History   Socioeconomic History  . Marital status: Married    Spouse name: Not on file  . Number of children: Not on  file  . Years of education: Not on file  . Highest education level: Not on file  Social Needs  . Financial resource strain: Not on file  . Food insecurity - worry: Not on file  . Food insecurity - inability: Not on file  . Transportation needs - medical: Not on file  . Transportation needs - non-medical: Not on file  Occupational History  . Not on file  Tobacco Use  . Smoking status: Never Smoker  . Smokeless tobacco: Never Used  Substance and Sexual Activity  . Alcohol use: Yes    Alcohol/week: 0.0 oz  . Drug use: No  . Sexual activity: Not Currently  Other Topics Concern  . Not on file  Social History Narrative  . Not on file    No Known Allergies  Outpatient Medications Prior to Visit  Medication Sig Dispense Refill  . albuterol (PROVENTIL HFA;VENTOLIN HFA) 108 (90 Base) MCG/ACT inhaler Inhale 2 puffs every 6 hours as needed    . ascorbic acid (VITAMIN C) 250 MG tablet Take 1 tablet by mouth daily.    . Biotin 10 MG TABS Take 1 tablet by mouth daily.    . Cholecalciferol (VITAMIN D PO) Take by mouth.    . fexofenadine (ALLEGRA) 180 MG tablet TAKE 1 TABLET BY MOUTH EVERY DAY 30 tablet 0  . fluticasone (FLONASE) 50 MCG/ACT nasal spray SPRAY 2 SPRAYS INTO EACH NOSTRIL EVERY DAY 16 g 11  .  Fluticasone-Salmeterol (ADVAIR) 250-50 MCG/DOSE AEPB Inhale 1 puff into the lungs daily. Dr [name] 60 each 6  . gabapentin (NEURONTIN) 100 MG capsule Take 1 capsule by mouth 3 (three) times daily. Dr Burt Ek    . losartan-hydrochlorothiazide (HYZAAR) 100-25 MG tablet Take 1 tablet by mouth daily. 90 tablet 1  . meloxicam (MOBIC) 15 MG tablet TAKE 1 TABLET BY MOUTH EVERY DAY 30 tablet 0  . Omega-3 Fatty Acids (FISH OIL) 1000 MG CAPS Take 1 capsule by mouth 2 (two) times daily.    . simvastatin (ZOCOR) 20 MG tablet Take 1 tablet (20 mg total) by mouth daily. 90 tablet 1  . vitamin E 1000 UNIT capsule Take 1 capsule by mouth daily.    . fluticasone (FLONASE) 50 MCG/ACT nasal spray SPRAY 2  SPRAYS INTO EACH NOSTRIL EVERY DAY 16 g 1   No facility-administered medications prior to visit.     Review of Systems  Constitutional: Positive for chills and fever. Negative for diaphoresis, malaise/fatigue and weight loss.  HENT: Positive for congestion, hoarse voice, sinus pressure and sore throat. Negative for ear discharge, ear pain and sneezing.   Eyes: Negative for blurred vision.  Respiratory: Positive for cough. Negative for sputum production, shortness of breath and wheezing.   Cardiovascular: Negative for chest pain, palpitations and leg swelling.  Gastrointestinal: Negative for abdominal pain, blood in stool, constipation, diarrhea, heartburn, melena, nausea and vomiting.  Genitourinary: Negative for dysuria, frequency, hematuria and urgency.  Musculoskeletal: Negative for back pain, joint pain, myalgias and neck pain.  Skin: Negative for rash.  Neurological: Positive for headaches. Negative for dizziness, tingling, sensory change and focal weakness.  Endo/Heme/Allergies: Negative for environmental allergies and polydipsia. Does not bruise/bleed easily.  Psychiatric/Behavioral: Negative for depression and suicidal ideas. The patient is not nervous/anxious and does not have insomnia.      Objective  Vitals:   01/07/17 1507  BP: 130/80  Pulse: 80  Temp: (!) 101 F (38.3 C)  TempSrc: Oral  Weight: 148 lb (67.1 kg)  Height: 5' (1.524 m)    Physical Exam  Constitutional: She is well-developed, well-nourished, and in no distress. No distress.  HENT:  Head: Normocephalic and atraumatic.  Right Ear: Tympanic membrane, external ear and ear canal normal.  Left Ear: Tympanic membrane, external ear and ear canal normal.  Nose: Right sinus exhibits maxillary sinus tenderness. Right sinus exhibits no frontal sinus tenderness. Left sinus exhibits no maxillary sinus tenderness and no frontal sinus tenderness.  Mouth/Throat: Oropharynx is clear and moist.  Eyes: Conjunctivae and  EOM are normal. Pupils are equal, round, and reactive to light. Right eye exhibits no discharge. Left eye exhibits no discharge.  Neck: Normal range of motion. Neck supple. No hepatojugular reflux and no JVD present. No thyromegaly present.  Cardiovascular: Normal rate, regular rhythm, S1 normal, S2 normal, normal heart sounds, intact distal pulses and normal pulses. PMI is not displaced. Exam reveals no gallop, no S3, no S4 and no friction rub.  No murmur heard. Pulmonary/Chest: Effort normal and breath sounds normal. She has no wheezes. She has no rales.  Abdominal: Soft. Normal aorta and bowel sounds are normal. She exhibits no mass. There is no hepatosplenomegaly. There is no tenderness. There is no guarding and no CVA tenderness.  Musculoskeletal: Normal range of motion. She exhibits no edema.  Lymphadenopathy:       Head (right side): No submandibular adenopathy present.       Head (left side): No submandibular adenopathy present.  She has no cervical adenopathy.  Neurological: She is alert. She has normal reflexes.  Skin: Skin is warm and dry. She is not diaphoretic.  Psychiatric: Mood and affect normal.  Nursing note and vitals reviewed.     Assessment & Plan  Problem List Items Addressed This Visit    None    Visit Diagnoses    Acute non-recurrent maxillary sinusitis    -  Primary   Relevant Medications   amoxicillin-clavulanate (AUGMENTIN) 875-125 MG tablet   Fever and chills       Relevant Orders   POCT Influenza A/B (Completed)      Meds ordered this encounter  Medications  . amoxicillin-clavulanate (AUGMENTIN) 875-125 MG tablet    Sig: Take 1 tablet by mouth 2 (two) times daily.    Dispense:  20 tablet    Refill:  0      Dr. Otilio Miu [facility]  01/07/17

## 2017-01-12 ENCOUNTER — Other Ambulatory Visit: Payer: Self-pay | Admitting: Family Medicine

## 2017-01-12 DIAGNOSIS — M1990 Unspecified osteoarthritis, unspecified site: Secondary | ICD-10-CM

## 2017-01-14 ENCOUNTER — Other Ambulatory Visit: Payer: Self-pay | Admitting: Family Medicine

## 2017-01-14 DIAGNOSIS — M1990 Unspecified osteoarthritis, unspecified site: Secondary | ICD-10-CM

## 2017-02-15 ENCOUNTER — Other Ambulatory Visit: Payer: Self-pay | Admitting: Family Medicine

## 2017-02-15 DIAGNOSIS — M1990 Unspecified osteoarthritis, unspecified site: Secondary | ICD-10-CM

## 2017-02-19 ENCOUNTER — Other Ambulatory Visit: Payer: Self-pay | Admitting: Family Medicine

## 2017-04-13 ENCOUNTER — Other Ambulatory Visit: Payer: Self-pay | Admitting: Family Medicine

## 2017-04-13 DIAGNOSIS — M1990 Unspecified osteoarthritis, unspecified site: Secondary | ICD-10-CM

## 2017-04-17 ENCOUNTER — Other Ambulatory Visit: Payer: Self-pay | Admitting: Family Medicine

## 2017-04-17 DIAGNOSIS — M1990 Unspecified osteoarthritis, unspecified site: Secondary | ICD-10-CM

## 2017-04-30 ENCOUNTER — Other Ambulatory Visit: Payer: Self-pay | Admitting: Family Medicine

## 2017-05-05 ENCOUNTER — Other Ambulatory Visit: Payer: Self-pay | Admitting: Family Medicine

## 2017-05-05 DIAGNOSIS — Z8679 Personal history of other diseases of the circulatory system: Secondary | ICD-10-CM

## 2017-05-05 DIAGNOSIS — E7849 Other hyperlipidemia: Secondary | ICD-10-CM

## 2017-05-18 ENCOUNTER — Other Ambulatory Visit: Payer: Self-pay | Admitting: Family Medicine

## 2017-05-18 DIAGNOSIS — M1990 Unspecified osteoarthritis, unspecified site: Secondary | ICD-10-CM

## 2017-06-15 ENCOUNTER — Other Ambulatory Visit: Payer: Self-pay | Admitting: Family Medicine

## 2017-06-15 DIAGNOSIS — M1990 Unspecified osteoarthritis, unspecified site: Secondary | ICD-10-CM

## 2017-07-21 ENCOUNTER — Other Ambulatory Visit: Payer: Self-pay | Admitting: Family Medicine

## 2017-07-21 DIAGNOSIS — M1990 Unspecified osteoarthritis, unspecified site: Secondary | ICD-10-CM

## 2017-08-05 ENCOUNTER — Other Ambulatory Visit: Payer: Self-pay | Admitting: Family Medicine

## 2017-08-05 DIAGNOSIS — E7849 Other hyperlipidemia: Secondary | ICD-10-CM

## 2017-08-15 ENCOUNTER — Ambulatory Visit: Payer: BC Managed Care – PPO | Admitting: Family Medicine

## 2017-08-15 ENCOUNTER — Encounter: Payer: Self-pay | Admitting: Family Medicine

## 2017-08-15 VITALS — BP 138/70 | HR 76 | Ht 60.0 in | Wt 148.0 lb

## 2017-08-15 DIAGNOSIS — I10 Essential (primary) hypertension: Secondary | ICD-10-CM | POA: Diagnosis not present

## 2017-08-15 DIAGNOSIS — E7849 Other hyperlipidemia: Secondary | ICD-10-CM | POA: Diagnosis not present

## 2017-08-15 DIAGNOSIS — M722 Plantar fascial fibromatosis: Secondary | ICD-10-CM

## 2017-08-15 DIAGNOSIS — J301 Allergic rhinitis due to pollen: Secondary | ICD-10-CM

## 2017-08-15 DIAGNOSIS — Z8679 Personal history of other diseases of the circulatory system: Secondary | ICD-10-CM

## 2017-08-15 DIAGNOSIS — M1990 Unspecified osteoarthritis, unspecified site: Secondary | ICD-10-CM | POA: Diagnosis not present

## 2017-08-15 MED ORDER — SIMVASTATIN 20 MG PO TABS: ORAL_TABLET | ORAL | 1 refills | Status: DC

## 2017-08-15 MED ORDER — MELOXICAM 15 MG PO TABS: 15.0000 mg | ORAL_TABLET | Freq: Every day | ORAL | 1 refills | Status: DC

## 2017-08-15 MED ORDER — FLUTICASONE PROPIONATE 50 MCG/ACT NA SUSP: NASAL | 11 refills | Status: DC

## 2017-08-15 MED ORDER — LOSARTAN POTASSIUM-HCTZ 100-25 MG PO TABS: 1.0000 | ORAL_TABLET | Freq: Every day | ORAL | 1 refills | Status: DC

## 2017-08-15 NOTE — Assessment & Plan Note (Signed)
Chronic Controlled  Continue simvastatin 20 mg . Will check lipid panel.

## 2017-08-15 NOTE — Progress Notes (Signed)
Name: Kristin Mata   MRN: [id number]    DOB: [birth year]   Date:08/15/2017       Progress Note  Subjective  Chief Complaint  Chief Complaint  Patient presents with  . Hypertension  . Arthritis  . Allergic Rhinitis   . Hyperlipidemia    Hypertension  This is a chronic problem. The current episode started more than 1 year ago. The problem is unchanged. The problem is controlled. Pertinent negatives include no anxiety, blurred vision, chest pain, headaches, malaise/fatigue, neck pain, orthopnea, palpitations, peripheral edema, PND, shortness of breath or sweats. There are no associated agents to hypertension. Risk factors for coronary artery disease include dyslipidemia. Past treatments include angiotensin blockers and diuretics. The current treatment provides moderate improvement. There are no compliance problems.  There is no history of angina, kidney disease, CAD/MI, CVA, heart failure, left ventricular hypertrophy, PVD or retinopathy. There is no history of chronic renal disease, a hypertension causing med or renovascular disease.  Arthritis  Presents for follow-up visit. She complains of pain. The symptoms have been stable. Affected location: bilateral hands. Pertinent negatives include no diarrhea, dry eyes, dry mouth, dysuria, fatigue, fever, pain at night, pain while resting, rash, Raynaud's syndrome, uveitis or weight loss.  Hyperlipidemia  This is a chronic problem. The problem is controlled. Recent lipid tests were reviewed and are normal. She has no history of chronic renal disease, diabetes, hypothyroidism, liver disease, obesity or nephrotic syndrome. Factors aggravating her hyperlipidemia include thiazides. Pertinent negatives include no chest pain, focal sensory loss, focal weakness, leg pain, myalgias or shortness of breath. Current antihyperlipidemic treatment includes statins. The current treatment provides moderate improvement of lipids. There are no compliance problems.   There are no known risk factors for coronary artery disease.  Foot Injury   The incident occurred more than 1 week ago (pain working in yard). The incident occurred at home. There was no injury mechanism. The pain is present in the right foot. The pain is moderate. The pain has been constant since onset. Pertinent negatives include no inability to bear weight, loss of motion, numbness or tingling. The symptoms are aggravated by movement and palpation. She has tried NSAIDs (heel cord stretch) for the symptoms.    Essential hypertension Chronic Controlled Will continue losartan-HCTZ 100-25 mg daily Check renal panel  Familial multiple lipoprotein-type hyperlipidemia Chronic Controlled  Continue simvastatin 20 mg . Will check lipid panel.   Past Medical History:  Diagnosis Date  . Colon cancer ([place]) 2015   had chemo  . Hyperlipidemia   . Hypertension     Past Surgical History:  Procedure Laterality Date  . BREAST CYST ASPIRATION Bilateral    neg  . HAND SURGERY      Family History  Problem Relation Age of Onset  . Breast cancer Sister 8    Social History   Socioeconomic History  . Marital status: Married    Spouse name: Not on file  . Number of children: Not on file  . Years of education: Not on file  . Highest education level: Not on file  Occupational History  . Not on file  Social Needs  . Financial resource strain: Not on file  . Food insecurity:    Worry: Not on file    Inability: Not on file  . Transportation needs:    Medical: Not on file    Non-medical: Not on file  Tobacco Use  . Smoking status: Never Smoker  . Smokeless tobacco: Never Used  Substance  and Sexual Activity  . Alcohol use: Yes    Alcohol/week: 0.0 oz  . Drug use: No  . Sexual activity: Not Currently  Lifestyle  . Physical activity:    Days per week: Not on file    Minutes per session: Not on file  . Stress: Not on file  Relationships  . Social connections:    Talks on phone: Not on  file    Gets together: Not on file    Attends religious service: Not on file    Active member of club or organization: Not on file    Attends meetings of clubs or organizations: Not on file    Relationship status: Not on file  . Intimate partner violence:    Fear of current or ex partner: Not on file    Emotionally abused: Not on file    Physically abused: Not on file    Forced sexual activity: Not on file  Other Topics Concern  . Not on file  Social History Narrative  . Not on file    No Known Allergies  Outpatient Medications Prior to Visit  Medication Sig Dispense Refill  . albuterol (PROVENTIL HFA;VENTOLIN HFA) 108 (90 Base) MCG/ACT inhaler Inhale 2 puffs every 6 hours as needed    . ascorbic acid (VITAMIN C) 250 MG tablet Take 1 tablet by mouth daily.    . Biotin 10 MG TABS Take 1 tablet by mouth daily.    . Cholecalciferol (VITAMIN D PO) Take by mouth.    . fexofenadine (ALLEGRA) 180 MG tablet TAKE 1 TABLET BY MOUTH EVERY DAY 30 tablet 0  . Fluticasone-Salmeterol (ADVAIR) 250-50 MCG/DOSE AEPB Inhale 1 puff into the lungs daily. Dr [name] 60 each 6  . gabapentin (NEURONTIN) 100 MG capsule Take 1 capsule by mouth 3 (three) times daily. Dr Burt Ek    . Omega-3 Fatty Acids (FISH OIL) 1000 MG CAPS Take 1 capsule by mouth 2 (two) times daily.    . vitamin E 1000 UNIT capsule Take 1 capsule by mouth daily.    . fluticasone (FLONASE) 50 MCG/ACT nasal spray SPRAY 2 SPRAYS INTO EACH NOSTRIL EVERY DAY 16 g 11  . losartan-hydrochlorothiazide (HYZAAR) 100-25 MG tablet TAKE 1 TABLET BY MOUTH EVERY DAY 90 tablet 0  . meloxicam (MOBIC) 15 MG tablet TAKE 1 TABLET BY MOUTH EVERY DAY 30 tablet 0  . simvastatin (ZOCOR) 20 MG tablet TAKE 1 TABLET BY MOUTH EVERY DAY NEEDS APPT 30 tablet 0  . amoxicillin-clavulanate (AUGMENTIN) 875-125 MG tablet Take 1 tablet by mouth 2 (two) times daily. 20 tablet 0  . fluticasone (FLONASE) 50 MCG/ACT nasal spray SPRAY 2 SPRAYS INTO EACH NOSTRIL EVERY DAY 16 g  11  . meloxicam (MOBIC) 15 MG tablet TAKE 1 TABLET BY MOUTH EVERY DAY 30 tablet 0   No facility-administered medications prior to visit.     Review of Systems  Constitutional: Negative for chills, fatigue, fever, malaise/fatigue and weight loss.  HENT: Negative for ear discharge, ear pain and sore throat.   Eyes: Negative for blurred vision.  Respiratory: Negative for cough, sputum production, shortness of breath and wheezing.   Cardiovascular: Negative for chest pain, palpitations, orthopnea, leg swelling and PND.  Gastrointestinal: Negative for abdominal pain, blood in stool, constipation, diarrhea, heartburn, melena and nausea.  Genitourinary: Negative for dysuria, frequency, hematuria and urgency.  Musculoskeletal: Positive for arthritis. Negative for back pain, joint pain, myalgias and neck pain.  Skin: Negative for rash.  Neurological: Negative for dizziness, tingling, sensory  change, focal weakness, numbness and headaches.  Endo/Heme/Allergies: Negative for environmental allergies and polydipsia. Does not bruise/bleed easily.  Psychiatric/Behavioral: Negative for depression and suicidal ideas. The patient is not nervous/anxious and does not have insomnia.      Objective  Vitals:   08/15/17 1336  BP: 138/70  Pulse: 76  Weight: 148 lb (67.1 kg)  Height: 5' (1.524 m)    Physical Exam  Constitutional: She is oriented to person, place, and time. She appears well-developed and well-nourished.  HENT:  Head: Normocephalic.  Right Ear: External ear normal.  Left Ear: External ear normal.  Mouth/Throat: Oropharynx is clear and moist.  Eyes: Pupils are equal, round, and reactive to light. Conjunctivae and EOM are normal. Lids are everted and swept, no foreign bodies found. Left eye exhibits no hordeolum. No foreign body present in the left eye. Right conjunctiva is not injected. Left conjunctiva is not injected. No scleral icterus.  Neck: Normal range of motion. Neck supple. No  JVD present. No tracheal deviation present. No thyromegaly present.  Cardiovascular: Normal rate, regular rhythm, normal heart sounds and intact distal pulses. Exam reveals no gallop and no friction rub.  No murmur heard. Pulmonary/Chest: Effort normal and breath sounds normal. No respiratory distress. She has no wheezes. She has no rales.  Abdominal: Soft. Bowel sounds are normal. She exhibits no mass. There is no hepatosplenomegaly. There is no tenderness. There is no rebound and no guarding.  Musculoskeletal: Normal range of motion. She exhibits no edema.       Right foot: There is tenderness. There is normal range of motion.  Lymphadenopathy:    She has no cervical adenopathy.  Neurological: She is alert and oriented to person, place, and time. She has normal strength. She displays normal reflexes. No cranial nerve deficit.  Skin: Skin is warm. No rash noted.  Psychiatric: She has a normal mood and affect. Her mood appears not anxious. She does not exhibit a depressed mood.  Nursing note and vitals reviewed.     Assessment & Plan  Problem List Items Addressed This Visit      Cardiovascular and Mediastinum   Essential hypertension    Chronic Controlled Will continue losartan-HCTZ 100-25 mg daily Check renal panel      Relevant Medications   simvastatin (ZOCOR) 20 MG tablet   losartan-hydrochlorothiazide (HYZAAR) 100-25 MG tablet     Other   H/O: HTN (hypertension) - Primary   Relevant Medications   losartan-hydrochlorothiazide (HYZAAR) 100-25 MG tablet   Other Relevant Orders   Renal Function Panel   Familial multiple lipoprotein-type hyperlipidemia    Chronic Controlled  Continue simvastatin 20 mg . Will check lipid panel.      Relevant Medications   simvastatin (ZOCOR) 20 MG tablet   losartan-hydrochlorothiazide (HYZAAR) 100-25 MG tablet   Other Relevant Orders   Lipid panel    Other Visit Diagnoses    Arthritis       Chronic stable osteoarthritis bilateral  hands/PIP/DIP joints Continue meloxicam 15 mg daily Eval renal status with renal panel   Relevant Medications   meloxicam (MOBIC) 15 MG tablet   Seasonal allergic rhinitis due to pollen       Episodic Controlled Continue Flonase nasal inhaler prn   Relevant Medications   fluticasone (FLONASE) 50 MCG/ACT nasal spray   Plantar fasciitis       New onset Right foot . cont meloxicam and rehab exercises given.      Meds ordered this encounter  Medications  .  meloxicam (MOBIC) 15 MG tablet    Sig: Take 1 tablet (15 mg total) by mouth daily.    Dispense:  90 tablet    Refill:  1  . simvastatin (ZOCOR) 20 MG tablet    Sig: TAKE 1 TABLET BY MOUTH EVERY DAY NEEDS APPT    Dispense:  90 tablet    Refill:  1    sched appt for meds  . losartan-hydrochlorothiazide (HYZAAR) 100-25 MG tablet    Sig: Take 1 tablet by mouth daily.    Dispense:  90 tablet    Refill:  1  . fluticasone (FLONASE) 50 MCG/ACT nasal spray    Sig: SPRAY 2 SPRAYS INTO EACH NOSTRIL EVERY DAY    Dispense:  16 g    Refill:  11    Not seen in 9 months- sched appt for med refills      Dr. [facility]  08/15/17

## 2017-08-15 NOTE — Patient Instructions (Signed)
Plantar Fasciitis Rehab Ask your health care provider which exercises are safe for you. Do exercises exactly as told by your health care provider and adjust them as directed. It is normal to feel mild stretching, pulling, tightness, or discomfort as you do these exercises, but you should stop right away if you feel sudden pain or your pain gets worse. Do not begin these exercises until told by your health care provider. Stretching and range of motion exercises These exercises warm up your muscles and joints and improve the movement and flexibility of your foot. These exercises also help to relieve pain. Exercise A: Plantar fascia stretch  1. Sit with your left / right leg crossed over your opposite knee. 2. Hold your heel with one hand with that thumb near your arch. With your other hand, hold your toes and gently pull them back toward the top of your foot. You should feel a stretch on the bottom of your toes or your foot or both. 3. Hold this stretch for__________ seconds. 4. Slowly release your toes and return to the starting position. Repeat __________ times. Complete this exercise __________ times a day. Exercise B: Gastroc, standing  1. Stand with your hands against a wall. 2. Extend your left / right leg behind you, and bend your front knee slightly. 3. Keeping your heels on the floor and keeping your back knee straight, shift your weight toward the wall without arching your back. You should feel a gentle stretch in your left / right calf. 4. Hold this position for __________ seconds. Repeat __________ times. Complete this exercise __________ times a day. Exercise C: Soleus, standing 1. Stand with your hands against a wall. 2. Extend your left / right leg behind you, and bend your front knee slightly. 3. Keeping your heels on the floor, bend your back knee and slightly shift your weight over the back leg. You should feel a gentle stretch deep in your calf. 4. Hold this position for  __________ seconds. Repeat __________ times. Complete this exercise __________ times a day. Exercise D: Gastrocsoleus, standing 1. Stand with the ball of your left / right foot on a step. The ball of your foot is on the walking surface, right under your toes. 2. Keep your other foot firmly on the same step. 3. Hold onto the wall or a railing for balance. 4. Slowly lift your other foot, allowing your body weight to press your heel down over the edge of the step. You should feel a stretch in your left / right calf. 5. Hold this position for __________ seconds. 6. Return both feet to the step. 7. Repeat this exercise with a slight bend in your left / right knee. Repeat __________ times with your left / right knee straight and __________ times with your left / right knee bent. Complete this exercise __________ times a day. Balance exercise This exercise builds your balance and strength control of your arch to help take pressure off your plantar fascia. Exercise E: Single leg stand 1. Without shoes, stand near a railing or in a doorway. You may hold onto the railing or door frame as needed. 2. Stand on your left / right foot. Keep your big toe down on the floor and try to keep your arch lifted. Do not let your foot roll inward. 3. Hold this position for __________ seconds. 4. If this exercise is too easy, you can try it with your eyes closed or while standing on a pillow. Repeat __________ times. Complete  this exercise __________ times a day. This information is not intended to replace advice given to you by your health care provider. Make sure you discuss any questions you have with your health care provider. Document Released: 01/14/2005 Document Revised: 09/19/2015 Document Reviewed: 11/28/2014 Elsevier Interactive Patient Education  2018 [place]. Plantar Fasciitis Plantar fasciitis is a painful foot condition that affects the heel. It occurs when the band of tissue that connects the toes  to the heel bone (plantar fascia) becomes irritated. This can happen after exercising too much or doing other repetitive activities (overuse injury). The pain from plantar fasciitis can range from mild irritation to severe pain that makes it difficult for you to walk or move. The pain is usually worse in the morning or after you have been sitting or lying down for a while. What are the causes? This condition may be caused by:  Standing for long periods of time.  Wearing shoes that do not fit.  Doing high-impact activities, including running, aerobics, and ballet.  Being overweight.  Having an abnormal way of walking (gait).  Having tight calf muscles.  Having high arches in your feet.  Starting a new athletic activity.  What are the signs or symptoms? The main symptom of this condition is heel pain. Other symptoms include:  Pain that gets worse after activity or exercise.  Pain that is worse in the morning or after resting.  Pain that goes away after you walk for a few minutes.  How is this diagnosed? This condition may be diagnosed based on your signs and symptoms. Your health care provider will also do a physical exam to check for:  A tender area on the bottom of your foot.  A high arch in your foot.  Pain when you move your foot.  Difficulty moving your foot.  You may also need to have imaging studies to confirm the diagnosis. These can include:  X-rays.  Ultrasound.  MRI.  How is this treated? Treatment for plantar fasciitis depends on the severity of the condition. Your treatment may include:  Rest, ice, and over-the-counter pain medicines to manage your pain.  Exercises to stretch your calves and your plantar fascia.  A splint that holds your foot in a stretched, upward position while you sleep (night splint).  Physical therapy to relieve symptoms and prevent problems in the future.  Cortisone injections to relieve severe pain.  Extracorporeal shock  wave therapy (ESWT) to stimulate damaged plantar fascia with electrical impulses. It is often used as a last resort before surgery.  Surgery, if other treatments have not worked after 12 months.  Follow these instructions at home:  Take medicines only as directed by your health care provider.  Avoid activities that cause pain.  Roll the bottom of your foot over a bag of ice or a bottle of cold water. Do this for 20 minutes, 3-4 times a day.  Perform simple stretches as directed by your health care provider.  Try wearing athletic shoes with air-sole or gel-sole cushions or soft shoe inserts.  Wear a night splint while sleeping, if directed by your health care provider.  Keep all follow-up appointments with your health care provider. How is this prevented?  Do not perform exercises or activities that cause heel pain.  Consider finding low-impact activities if you continue to have problems.  Lose weight if you need to. The best way to prevent plantar fasciitis is to avoid the activities that aggravate your plantar fascia. Contact a health  care provider if:  Your symptoms do not go away after treatment with home care measures.  Your pain gets worse.  Your pain affects your ability to move or do your daily activities. This information is not intended to replace advice given to you by your health care provider. Make sure you discuss any questions you have with your health care provider. Document Released: 10/09/2000 Document Revised: 06/19/2015 Document Reviewed: 11/24/2013 Elsevier Interactive Patient Education  [employer].

## 2017-08-15 NOTE — Assessment & Plan Note (Signed)
Chronic Controlled Will continue losartan-HCTZ 100-25 mg daily Check renal panel

## 2017-08-16 LAB — RENAL FUNCTION PANEL
Albumin: 4.5 g/dL (ref 3.6–4.8)
BUN / CREAT RATIO: 22 (ref 12–28)
BUN: 15 mg/dL (ref 8–27)
CO2: 26 mmol/L (ref 20–29)
Calcium: 9.5 mg/dL (ref 8.7–10.3)
Chloride: 100 mmol/L (ref 96–106)
Creatinine, Ser: 0.68 mg/dL (ref 0.57–1.00)
GFR, EST AFRICAN AMERICAN: 107 mL/min/{1.73_m2} (ref >59)
GFR, EST NON AFRICAN AMERICAN: 93 mL/min/{1.73_m2} (ref >59)
GLUCOSE: 85 mg/dL (ref 65–99)
PHOSPHORUS: 3.2 mg/dL (ref 2.5–4.5)
Potassium: 3.9 mmol/L (ref 3.5–5.2)
SODIUM: 140 mmol/L (ref 134–144)

## 2017-08-16 LAB — LIPID PANEL
CHOL/HDL RATIO: 3.1 ratio (ref 0.0–4.4)
Cholesterol, Total: 157 mg/dL (ref 100–199)
HDL: 51 mg/dL (ref >39)
LDL CALC: 81 mg/dL (ref 0–99)
Triglycerides: 126 mg/dL (ref 0–149)
VLDL Cholesterol Cal: 25 mg/dL (ref 5–40)

## 2017-08-18 ENCOUNTER — Other Ambulatory Visit: Payer: Self-pay

## 2017-08-18 DIAGNOSIS — Z23 Encounter for immunization: Secondary | ICD-10-CM

## 2017-08-25 ENCOUNTER — Other Ambulatory Visit: Payer: Self-pay

## 2017-08-25 DIAGNOSIS — M722 Plantar fascial fibromatosis: Secondary | ICD-10-CM

## 2017-10-01 ENCOUNTER — Other Ambulatory Visit: Payer: Self-pay | Admitting: Obstetrics and Gynecology

## 2017-10-01 DIAGNOSIS — Z1231 Encounter for screening mammogram for malignant neoplasm of breast: Secondary | ICD-10-CM

## 2017-10-07 ENCOUNTER — Ambulatory Visit
Admission: RE | Admit: 2017-10-07 | Discharge: 2017-10-07 | Disposition: A | Discharge status: Home / Self Care | Payer: BC Managed Care – PPO | Source: Ambulatory Visit | Attending: Obstetrics and Gynecology | Admitting: Obstetrics and Gynecology

## 2017-10-07 ENCOUNTER — Ambulatory Visit: Payer: BC Managed Care – PPO

## 2017-10-07 DIAGNOSIS — Z1231 Encounter for screening mammogram for malignant neoplasm of breast: Secondary | ICD-10-CM | POA: Insufficient documentation

## 2017-10-07 HISTORY — DX: Personal history of antineoplastic chemotherapy: Z92.21

## 2018-01-12 ENCOUNTER — Ambulatory Visit: Payer: BC Managed Care – PPO | Admitting: Family Medicine

## 2018-02-08 ENCOUNTER — Other Ambulatory Visit: Payer: Self-pay | Admitting: Family Medicine

## 2018-02-08 DIAGNOSIS — M1990 Unspecified osteoarthritis, unspecified site: Secondary | ICD-10-CM

## 2018-03-09 ENCOUNTER — Other Ambulatory Visit: Payer: Self-pay | Admitting: Family Medicine

## 2018-03-09 DIAGNOSIS — M1990 Unspecified osteoarthritis, unspecified site: Secondary | ICD-10-CM

## 2018-03-17 ENCOUNTER — Other Ambulatory Visit: Payer: Self-pay | Admitting: Family Medicine

## 2018-03-17 DIAGNOSIS — E7849 Other hyperlipidemia: Secondary | ICD-10-CM

## 2018-03-30 ENCOUNTER — Other Ambulatory Visit: Payer: Self-pay | Admitting: Family Medicine

## 2018-03-30 DIAGNOSIS — M1990 Unspecified osteoarthritis, unspecified site: Secondary | ICD-10-CM

## 2018-04-15 ENCOUNTER — Other Ambulatory Visit: Payer: Self-pay | Admitting: Family Medicine

## 2018-04-15 DIAGNOSIS — E7849 Other hyperlipidemia: Secondary | ICD-10-CM

## 2018-04-16 ENCOUNTER — Other Ambulatory Visit: Payer: Self-pay

## 2018-04-16 ENCOUNTER — Ambulatory Visit: Payer: BC Managed Care – PPO | Admitting: Family Medicine

## 2018-04-16 ENCOUNTER — Encounter: Payer: Self-pay | Admitting: Family Medicine

## 2018-04-16 VITALS — BP 130/80 | HR 80 | Ht 60.0 in | Wt 148.0 lb

## 2018-04-16 DIAGNOSIS — T451X5A Adverse effect of antineoplastic and immunosuppressive drugs, initial encounter: Secondary | ICD-10-CM

## 2018-04-16 DIAGNOSIS — Z8679 Personal history of other diseases of the circulatory system: Secondary | ICD-10-CM | POA: Diagnosis not present

## 2018-04-16 DIAGNOSIS — G620 Drug-induced polyneuropathy: Secondary | ICD-10-CM

## 2018-04-16 DIAGNOSIS — E7849 Other hyperlipidemia: Secondary | ICD-10-CM

## 2018-04-16 DIAGNOSIS — J301 Allergic rhinitis due to pollen: Secondary | ICD-10-CM

## 2018-04-16 MED ORDER — LOSARTAN POTASSIUM-HCTZ 100-25 MG PO TABS: 1.0000 | ORAL_TABLET | Freq: Every day | ORAL | 1 refills | Status: DC

## 2018-04-16 MED ORDER — FLUTICASONE PROPIONATE 50 MCG/ACT NA SUSP: NASAL | 11 refills | Status: DC

## 2018-04-16 MED ORDER — GABAPENTIN 100 MG PO CAPS: 100.0000 mg | ORAL_CAPSULE | Freq: Three times a day (TID) | ORAL | 1 refills | Status: DC

## 2018-04-16 MED ORDER — SIMVASTATIN 20 MG PO TABS: ORAL_TABLET | ORAL | 1 refills | Status: DC

## 2018-04-16 NOTE — Progress Notes (Signed)
Date:  04/16/2018   Name:  Kristin Mata   DOB:  06-11-1950   MRN:  [id number]   Chief Complaint: Hypertension and Hyperlipidemia  Hypertension  This is a chronic problem. The current episode started in the past 7 days. The problem has been gradually worsening since onset. The problem is controlled. Pertinent negatives include no anxiety, blurred vision, chest pain, headaches, malaise/fatigue, neck pain, orthopnea, palpitations, peripheral edema, PND, shortness of breath or sweats. There are no associated agents to hypertension. Risk factors for coronary artery disease include dyslipidemia and post-menopausal state. Past treatments include angiotensin blockers and diuretics. The current treatment provides mild improvement. There are no compliance problems.  There is no history of angina, kidney disease, CAD/MI, CVA, heart failure, left ventricular hypertrophy, PVD or retinopathy. There is no history of chronic renal disease, a hypertension causing med or renovascular disease.  Hyperlipidemia  This is a chronic problem. The current episode started more than 1 year ago. The problem is controlled. Recent lipid tests were reviewed and are normal. She has no history of chronic renal disease, diabetes, hypothyroidism, liver disease, obesity or nephrotic syndrome. Factors aggravating her hyperlipidemia include thiazides. Pertinent negatives include no chest pain, focal sensory loss, focal weakness, leg pain, myalgias or shortness of breath. Current antihyperlipidemic treatment includes statins (omega 3). The current treatment provides moderate improvement of lipids. There are no compliance problems.  Risk factors for coronary artery disease include dyslipidemia and hypertension.  Neurologic Problem  The patient's pertinent negatives include no clumsiness, focal sensory loss, focal weakness, near-syncope, slurred speech, syncope or weakness. Primary symptoms comment: for neuropathy. The current episode  started in the past 7 days. The neurological problem developed gradually. The problem has been waxing and waning since onset. Pertinent negatives include no abdominal pain, back pain, chest pain, dizziness, fatigue, fever, headaches, light-headedness, nausea, neck pain, palpitations, shortness of breath or vomiting. There is no history of liver disease.  URI   This is a new (for allergies) problem. The current episode started more than 1 year ago. The problem has been waxing and waning. There has been no fever. The fever has been present for less than 1 day. Pertinent negatives include no abdominal pain, chest pain, congestion, coughing, diarrhea, dysuria, ear pain, headaches, joint pain, joint swelling, nausea, neck pain, rash, rhinorrhea, sinus pain, sneezing, sore throat, vomiting or wheezing.    Review of Systems  Constitutional: Negative.  Negative for chills, fatigue, fever, malaise/fatigue and unexpected weight change.  HENT: Negative for congestion, ear discharge, ear pain, rhinorrhea, sinus pressure, sinus pain, sneezing and sore throat.   Eyes: Negative for blurred vision, photophobia, pain, discharge, redness and itching.  Respiratory: Negative for cough, shortness of breath, wheezing and stridor.   Cardiovascular: Negative for chest pain, palpitations, orthopnea, PND and near-syncope.  Gastrointestinal: Negative for abdominal pain, blood in stool, constipation, diarrhea, nausea and vomiting.  Endocrine: Negative for cold intolerance, heat intolerance, polydipsia, polyphagia and polyuria.  Genitourinary: Negative for dysuria, flank pain, frequency, hematuria, menstrual problem, pelvic pain, urgency, vaginal bleeding and vaginal discharge.  Musculoskeletal: Negative for arthralgias, back pain, joint pain, myalgias and neck pain.  Skin: Negative for rash.  Allergic/Immunologic: Negative for environmental allergies and food allergies.  Neurological: Negative for dizziness, focal weakness,  syncope, weakness, light-headedness, numbness and headaches.  Hematological: Negative for adenopathy. Does not bruise/bleed easily.  Psychiatric/Behavioral: Negative for dysphoric mood. The patient is not nervous/anxious.     Patient Active Problem List   Diagnosis Date Noted  .  Essential hypertension 10/31/2016  . 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 H/O: HTN (hypertension) 06/07/2014  . Asthma, mild intermittent 06/07/2014    No Known Allergies  Past Surgical History:  Procedure Laterality Date  . ABDOMINAL HYSTERECTOMY    . BREAST CYST ASPIRATION Bilateral    neg  . HAND SURGERY      Social History   Tobacco Use  . Smoking status: Never Smoker  . Smokeless tobacco: Never Used  Substance Use Topics  . Alcohol use: Yes    Alcohol/week: 0.0 standard drinks  . Drug use: No     Medication list has been reviewed and updated.  Current Meds  Medication Sig  . albuterol (PROVENTIL HFA;VENTOLIN HFA) 108 (90 Base) MCG/ACT inhaler [name]  . ascorbic acid (VITAMIN C) 250 MG tablet Take 1 tablet by mouth daily.  . Biotin 10 MG TABS Take 1 tablet by mouth daily.  . Cholecalciferol (VITAMIN D PO) Take by mouth.  . fexofenadine (ALLEGRA) 180 MG tablet TAKE 1 TABLET BY MOUTH EVERY DAY  . fluticasone (FLONASE) 50 MCG/ACT nasal spray SPRAY 2 SPRAYS INTO EACH NOSTRIL EVERY DAY (Patient taking differently: SPRAY 2 SPRAYS INTO EACH NOSTRIL EVERY DAY/ [name])  . Fluticasone-Salmeterol (ADVAIR) 250-50 MCG/DOSE AEPB Inhale 1 puff into the lungs daily. Dr [name]  . gabapentin (NEURONTIN) 100 MG capsule Take 1 capsule by mouth 3 (three) times daily. Dr Burt Ek  . losartan-hydrochlorothiazide (HYZAAR) 100-25 MG tablet Take 1 tablet by mouth daily.  . meloxicam (MOBIC) 15 MG tablet TAKE 1 TABLET BY MOUTH EVERY DAY  . Omega-3 Fatty Acids (FISH OIL) 1000 MG CAPS Take 1  capsule by mouth 2 (two) times daily.  Marland Kitchen omeprazole (PRILOSEC) 10 MG capsule Take 1 capsule by mouth daily.  . simvastatin (ZOCOR) 20 MG tablet TAKE 1 TABLET BY MOUTH EVERY DAY NEEDS APPT  . vitamin E 1000 UNIT capsule Take 1 capsule by mouth daily.    PHQ 2/9 Scores 10/31/2016 07/18/2015 06/09/2015  PHQ - 2 Score 0 0 0    Physical Exam Vitals signs and nursing note reviewed.  Constitutional:      General: She is not in acute distress.    Appearance: She is not diaphoretic.  HENT:     Head: Normocephalic and atraumatic.     Right Ear: Tympanic membrane, ear canal and external ear normal.     Left Ear: Tympanic membrane, ear canal and external ear normal.     Nose: Nose normal. No congestion or rhinorrhea.  Eyes:     General:        Right eye: No discharge.        Left eye: No discharge.     Conjunctiva/sclera: Conjunctivae normal.     Pupils: Pupils are equal, round, and reactive to light.  Neck:     Musculoskeletal: Normal range of motion and neck supple.     Thyroid: No thyromegaly.     Vascular: No JVD.  Cardiovascular:     Rate and Rhythm: Normal rate and regular rhythm.     Pulses: Normal pulses.     Heart sounds: Normal heart sounds. No murmur. No friction rub. No gallop.   Pulmonary:     Effort: Pulmonary effort is normal. No respiratory distress.     Breath sounds: Normal breath sounds. No stridor. No wheezing, rhonchi or rales.  Chest:     Chest wall: No tenderness.  Abdominal:  General: Bowel sounds are normal.     Palpations: Abdomen is soft. There is no mass.     Tenderness: There is no abdominal tenderness. There is no guarding.  Genitourinary:    General: Normal vulva.  Musculoskeletal: Normal range of motion.  Lymphadenopathy:     Cervical: No cervical adenopathy.  Skin:    General: Skin is warm and dry.  Neurological:     Mental Status: She is alert.     Cranial Nerves: No cranial nerve deficit.     Deep Tendon Reflexes: Reflexes are normal and  symmetric.     Wt Readings from Last 3 Encounters:  04/16/18 148 lb (67.1 kg)  08/15/17 148 lb (67.1 kg)  01/07/17 148 lb (67.1 kg)    BP 130/80   Pulse 80   Ht 5' (1.524 m)   Wt 148 lb (67.1 kg)   BMI 28.90 kg/m   Assessment and Plan: 1. Familial multiple lipoprotein-type hyperlipidemia Chronic.  Controlled.  Continue simvastatin 20 mg once a day.  Reviewed lipid panel from last year which was acceptable and will repeat in 6 months from today. - simvastatin (ZOCOR) 20 MG tablet; One a day  Dispense: 90 tablet; Refill: 1  2. H/O: HTN (hypertension) Chronic.  Old.  Continue losartan hydrochlorothiazide 100/25 mg viewed renal panel from last year and will repeat this in 6 months. - losartan-hydrochlorothiazide (HYZAAR) 100-25 MG tablet; Take 1 tablet by mouth daily.  Dispense: 90 tablet; Refill: 1  3. Seasonal allergic rhinitis due to pollen Chronic.  Episodic patient will continue regular use Flonase on an as-needed basis. - fluticasone (FLONASE) 50 MCG/ACT nasal spray; SPRAY 2 SPRAYS INTO EACH NOSTRIL EVERY DAY  Dispense: 16 g; Refill: 11  4. 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  We will assume responsibility for the Neurontin 100 mg times a day. - gabapentin (NEURONTIN) 100 MG capsule; Take 1 capsule (100 mg total) by mouth 3 (three) times daily. Dr Burt Ek  Dispense: 180 capsule; Refill: 1

## 2018-04-18 ENCOUNTER — Other Ambulatory Visit: Payer: Self-pay | Admitting: Family Medicine

## 2018-04-18 DIAGNOSIS — M1990 Unspecified osteoarthritis, unspecified site: Secondary | ICD-10-CM

## 2018-08-08 ENCOUNTER — Other Ambulatory Visit: Payer: Self-pay | Admitting: Family Medicine

## 2018-08-08 DIAGNOSIS — T451X5A Adverse effect of antineoplastic and immunosuppressive drugs, initial encounter: Secondary | ICD-10-CM

## 2018-08-08 DIAGNOSIS — G620 Drug-induced polyneuropathy: Secondary | ICD-10-CM

## 2018-08-12 ENCOUNTER — Other Ambulatory Visit: Payer: Self-pay | Admitting: Family Medicine

## 2018-08-12 DIAGNOSIS — T451X5A Adverse effect of antineoplastic and immunosuppressive drugs, initial encounter: Secondary | ICD-10-CM

## 2018-08-12 DIAGNOSIS — G620 Drug-induced polyneuropathy: Secondary | ICD-10-CM

## 2018-09-29 ENCOUNTER — Other Ambulatory Visit: Payer: Self-pay

## 2018-09-29 ENCOUNTER — Telehealth: Payer: Self-pay | Admitting: Family Medicine

## 2018-09-29 ENCOUNTER — Ambulatory Visit: Payer: BC Managed Care – PPO | Admitting: Family Medicine

## 2018-09-29 ENCOUNTER — Encounter: Payer: Self-pay | Admitting: Family Medicine

## 2018-09-29 VITALS — BP 120/70 | HR 72 | Temp 98.8°F | Ht 60.0 in | Wt 145.0 lb

## 2018-09-29 DIAGNOSIS — J301 Allergic rhinitis due to pollen: Secondary | ICD-10-CM

## 2018-09-29 DIAGNOSIS — E7849 Other hyperlipidemia: Secondary | ICD-10-CM

## 2018-09-29 DIAGNOSIS — Z8679 Personal history of other diseases of the circulatory system: Secondary | ICD-10-CM | POA: Diagnosis not present

## 2018-09-29 DIAGNOSIS — R69 Illness, unspecified: Secondary | ICD-10-CM

## 2018-09-29 DIAGNOSIS — J0100 Acute maxillary sinusitis, unspecified: Secondary | ICD-10-CM

## 2018-09-29 DIAGNOSIS — Z23 Encounter for immunization: Secondary | ICD-10-CM

## 2018-09-29 DIAGNOSIS — G620 Drug-induced polyneuropathy: Secondary | ICD-10-CM | POA: Diagnosis not present

## 2018-09-29 DIAGNOSIS — T451X5A Adverse effect of antineoplastic and immunosuppressive drugs, initial encounter: Secondary | ICD-10-CM

## 2018-09-29 DIAGNOSIS — I10 Essential (primary) hypertension: Secondary | ICD-10-CM

## 2018-09-29 MED ORDER — FEXOFENADINE HCL 180 MG PO TABS: 180.0000 mg | ORAL_TABLET | Freq: Every day | ORAL | 1 refills | Status: DC

## 2018-09-29 MED ORDER — SIMVASTATIN 20 MG PO TABS: ORAL_TABLET | ORAL | 1 refills | Status: DC

## 2018-09-29 MED ORDER — GABAPENTIN 100 MG PO CAPS: 100.0000 mg | ORAL_CAPSULE | Freq: Three times a day (TID) | ORAL | 1 refills | Status: DC

## 2018-09-29 MED ORDER — AMOXICILLIN 500 MG PO CAPS: 500.0000 mg | ORAL_CAPSULE | Freq: Three times a day (TID) | ORAL | 0 refills | Status: DC

## 2018-09-29 MED ORDER — LOSARTAN POTASSIUM-HCTZ 100-25 MG PO TABS: 1.0000 | ORAL_TABLET | Freq: Every day | ORAL | 1 refills | Status: DC

## 2018-09-29 MED ORDER — FLUTICASONE PROPIONATE 50 MCG/ACT NA SUSP: NASAL | 1 refills | Status: DC

## 2018-09-29 NOTE — Patient Instructions (Signed)

## 2018-09-29 NOTE — Progress Notes (Signed)
Date:  09/29/2018   Name:  Kristin Mata   DOB:  01-08-1951   MRN:  [id number]   Chief Complaint: Sinusitis (dry cough, drainage, hoarse, started Sunday- no fever, no contact), Asthma, Peripheral Neuropathy, Hyperlipidemia, and Hypertension  Sinusitis This is a new problem. The current episode started in the past 7 days. The problem has been gradually worsening since onset. There has been no fever. Her pain is at a severity of 4/10. The pain is mild. Associated symptoms include congestion, ear pain, a hoarse voice, sinus pressure and a sore throat. Pertinent negatives include no chills, coughing, diaphoresis, headaches, neck pain, shortness of breath, sneezing or swollen glands. The treatment provided no relief.  Asthma She complains of hoarse voice and wheezing. There is no chest tightness, cough, difficulty breathing, frequent throat clearing, hemoptysis, shortness of breath or sputum production. This is a new problem. The current episode started today. The problem occurs daily. Associated symptoms include ear congestion, ear pain and a sore throat. Pertinent negatives include no appetite change, chest pain, dyspnea on exertion, fever, headaches, heartburn, malaise/fatigue, myalgias, nasal congestion, orthopnea, PND, postnasal drip, rhinorrhea, sneezing, sweats, trouble swallowing or weight loss. Her symptoms are alleviated by beta-agonist. She reports moderate improvement on treatment. Her past medical history is significant for asthma.  Hyperlipidemia This is a chronic problem. The current episode started more than 1 year ago. The problem is controlled. Recent lipid tests were reviewed and are normal. She has no history of chronic renal disease, diabetes, hypothyroidism, liver disease, obesity or nephrotic syndrome. Factors aggravating her hyperlipidemia include thiazides. Pertinent negatives include no chest pain, focal sensory loss, focal weakness, leg pain, myalgias or shortness of breath.  Current antihyperlipidemic treatment includes statins. The current treatment provides moderate improvement of lipids. There are no compliance problems.  Risk factors for coronary artery disease include hypertension.  Hypertension This is a chronic problem. The current episode started more than 1 year ago. The problem has been gradually improving since onset. The problem is controlled. Pertinent negatives include no anxiety, blurred vision, chest pain, headaches, malaise/fatigue, neck pain, peripheral edema, PND, shortness of breath or sweats. There are no associated agents to hypertension. Risk factors for coronary artery disease include dyslipidemia. There are no compliance problems.  There is no history of angina, kidney disease, CAD/MI, CVA, heart failure, left ventricular hypertrophy, PVD or retinopathy. There is no history of chronic renal disease, a hypertension causing med or renovascular disease.    Review of Systems  Constitutional: Negative.  Negative for appetite change, chills, diaphoresis, fatigue, fever, malaise/fatigue, unexpected weight change and weight loss.  HENT: Positive for congestion, ear pain, hoarse voice, sinus pressure and sore throat. Negative for ear discharge, postnasal drip, rhinorrhea, sneezing and trouble swallowing.   Eyes: Negative for blurred vision, photophobia, pain, discharge, redness and itching.  Respiratory: Positive for wheezing. Negative for cough, hemoptysis, sputum production, shortness of breath and stridor.   Cardiovascular: Negative for chest pain, dyspnea on exertion and PND.  Gastrointestinal: Negative for abdominal pain, blood in stool, constipation, diarrhea, heartburn, nausea and vomiting.  Endocrine: Negative for cold intolerance, heat intolerance, polydipsia, polyphagia and polyuria.  Genitourinary: Negative for dysuria, flank pain, frequency, hematuria, menstrual problem, pelvic pain, urgency, vaginal bleeding and vaginal discharge.   Musculoskeletal: Negative for arthralgias, back pain, myalgias and neck pain.  Skin: Negative for rash.  Allergic/Immunologic: Negative for environmental allergies and food allergies.  Neurological: Negative for dizziness, focal weakness, weakness, light-headedness, numbness and headaches.  Hematological: Negative  for adenopathy. Does not bruise/bleed easily.  Psychiatric/Behavioral: Negative for dysphoric mood. The patient is not nervous/anxious.     Patient Active Problem List   Diagnosis Date Noted  . Essential hypertension 10/31/2016  . 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 H/O: HTN (hypertension) 06/07/2014  . Asthma, mild intermittent 06/07/2014    No Known Allergies  Past Surgical History:  Procedure Laterality Date  . ABDOMINAL HYSTERECTOMY    . BREAST CYST ASPIRATION Bilateral    neg  . HAND SURGERY      Social History   Tobacco Use  . Smoking status: Never Smoker  . Smokeless tobacco: Never Used  Substance Use Topics  . Alcohol use: Yes    Alcohol/week: 0.0 standard drinks  . Drug use: No     Medication list has been reviewed and updated.  Current Meds  Medication Sig  . albuterol (PROVENTIL HFA;VENTOLIN HFA) 108 (90 Base) MCG/ACT inhaler [name]  . ascorbic acid (VITAMIN C) 250 MG tablet Take 1 tablet by mouth daily.  Marland Kitchen aspirin EC 81 MG tablet Take 81 mg by mouth daily.  . Biotin 10 MG TABS Take 1 tablet by mouth daily.  . Cholecalciferol (VITAMIN D PO) Take by mouth.  . fexofenadine (ALLEGRA) 180 MG tablet TAKE 1 TABLET BY MOUTH EVERY DAY  . fluticasone (FLONASE) 50 MCG/ACT nasal spray SPRAY 2 SPRAYS INTO EACH NOSTRIL EVERY DAY  . Fluticasone-Salmeterol (ADVAIR) 250-50 MCG/DOSE AEPB Inhale 1 puff into the lungs daily. Dr [name]  . gabapentin (NEURONTIN) 100 MG capsule Take 1 capsule (100 mg total) by mouth 3 (three) times daily. Dr  Burt Ek  . losartan-hydrochlorothiazide (HYZAAR) 100-25 MG tablet Take 1 tablet by mouth daily.  . meloxicam (MOBIC) 15 MG tablet TAKE 1 TABLET BY MOUTH EVERY DAY  . Omega-3 Fatty Acids (FISH OIL) 1000 MG CAPS Take 1 capsule by mouth 2 (two) times daily.  Marland Kitchen omeprazole (PRILOSEC) 10 MG capsule Take 1 capsule by mouth daily.  . simvastatin (ZOCOR) 20 MG tablet One a day  . vitamin E 1000 UNIT capsule Take 1 capsule by mouth daily.    PHQ 2/9 Scores 09/29/2018 10/31/2016 07/18/2015 06/09/2015  PHQ - 2 Score 0 0 0 0  PHQ- 9 Score 0 - - -    BP Readings from Last 3 Encounters:  09/29/18 120/70  04/16/18 130/80  08/15/17 138/70    Physical Exam Vitals signs and nursing note reviewed.  Constitutional:      General: She is not in acute distress.    Appearance: Normal appearance. She is not diaphoretic.  HENT:     Head: Normocephalic and atraumatic.     Jaw: There is normal jaw occlusion.     Salivary Glands: Right salivary gland is tender. Right salivary gland is not diffusely enlarged. Left salivary gland is tender. Left salivary gland is not diffusely enlarged.     Right Ear: Tympanic membrane, ear canal and external ear normal.     Left Ear: Tympanic membrane, ear canal and external ear normal.     Nose: Nose normal. No congestion or rhinorrhea.     Mouth/Throat:     Lips: Pink.     Mouth: Mucous membranes are moist.     Palate: No mass and lesions.     Pharynx: Oropharynx is clear. Uvula midline.  Eyes:     General:        Right eye: No discharge.  Left eye: No discharge.     Conjunctiva/sclera: Conjunctivae normal.     Pupils: Pupils are equal, round, and reactive to light.  Neck:     Musculoskeletal: Normal range of motion and neck supple.     Thyroid: No thyromegaly.     Vascular: No JVD.  Cardiovascular:     Rate and Rhythm: Normal rate and regular rhythm.     Heart sounds: Normal heart sounds. No murmur. No friction rub. No gallop.   Pulmonary:     Effort:  Pulmonary effort is normal.     Breath sounds: Normal breath sounds.  Abdominal:     General: Bowel sounds are normal.     Palpations: Abdomen is soft. There is no mass.     Tenderness: There is no abdominal tenderness. There is no guarding.  Musculoskeletal: Normal range of motion.  Lymphadenopathy:     Cervical: No cervical adenopathy.  Skin:    General: Skin is warm and dry.  Neurological:     Mental Status: She is alert.     Deep Tendon Reflexes: Reflexes are normal and symmetric.     Wt Readings from Last 3 Encounters:  09/29/18 145 lb (65.8 kg)  04/16/18 148 lb (67.1 kg)  08/15/17 148 lb (67.1 kg)    BP 120/70   Pulse 72   Temp 98.8 F (37.1 C) (Oral)   Ht 5' (1.524 m)   Wt 145 lb (65.8 kg)   BMI 28.32 kg/m   Assessment and Plan:   1. 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  Controlled.  Continue gabapentin 100 mg 3 times a day. - gabapentin (NEURONTIN) 100 MG capsule; Take 1 capsule (100 mg total) by mouth 3 (three) times daily. Dr Burt Ek  Dispense: 270 capsule; Refill: 1  2. H/O: HTN (hypertension) See below  3. Familial multiple lipoprotein-type hyperlipidemia Chronic.  Controlled.  Continue simvastatin 20 mg once a day.  Will check lipid panel. - simvastatin (ZOCOR) 20 MG tablet; One a day  Dispense: 90 tablet; Refill: 1 - Lipid Panel With LDL/HDL Ratio  4. Essential hypertension Chronic.  Controlled.  Continue losartan hydrochlorothiazide 100-25 mg once a day.  Will continue renal function panel. - losartan-hydrochlorothiazide (HYZAAR) 100-25 MG tablet; Take 1 tablet by mouth daily.  Dispense: 90 tablet; Refill: 1 - Renal Function Panel  5. Taking medication for chronic disease Patient is currently on a statin we will check hepatic panel for evaluation of hepatotoxicity due to statin. - Hepatic function panel  6. Seasonal allergic rhinitis due to pollen Recurrent.  Will continue Allegra 180 and Flonase nasal spray daily. -  fexofenadine (ALLEGRA) 180 MG tablet; Take 1 tablet (180 mg total) by mouth daily.  Dispense: 90 tablet; Refill: 1 - fluticasone (FLONASE) 50 MCG/ACT nasal spray; SPRAY 2 SPRAYS INTO EACH NOSTRIL EVERY DAY  Dispense: 16 g; Refill: 1  7. Need for vaccination against Streptococcus pneumoniae using pneumococcal conjugate vaccine 13 Discussed and administered.  8. Acute sinusitis this is new onset.  Patient has symptoms and exam consistent with an acute sinusitis.  Will initiate amoxicillin 500 mg 3 times a day for 10 days.

## 2018-09-29 NOTE — Telephone Encounter (Signed)
She can come in this afternoon- please call. She needs to be told that we don't usually fill meds and sick visit at same time.

## 2018-09-29 NOTE — Telephone Encounter (Signed)
Pt needs her medications refilled, but also wants to come in cause she is feeling a little froggy thinks she might have a sinus infection. Wants to come in today if possible, please advise?

## 2018-09-30 LAB — LIPID PANEL WITH LDL/HDL RATIO
Cholesterol, Total: 156 mg/dL (ref 100–199)
HDL: 55 mg/dL (ref >39)
LDL Chol Calc (NIH): 86 mg/dL (ref 0–99)
LDL/HDL Ratio: 1.6 ratio (ref 0.0–3.2)
Triglycerides: 77 mg/dL (ref 0–149)
VLDL Cholesterol Cal: 15 mg/dL (ref 5–40)

## 2018-09-30 LAB — RENAL FUNCTION PANEL
Albumin: 4.4 g/dL (ref 3.8–4.8)
BUN/Creatinine Ratio: 22 (ref 12–28)
BUN: 13 mg/dL (ref 8–27)
CO2: 27 mmol/L (ref 20–29)
Calcium: 9.3 mg/dL (ref 8.7–10.3)
Chloride: 101 mmol/L (ref 96–106)
Creatinine, Ser: 0.59 mg/dL (ref 0.57–1.00)
GFR calc Af Amer: 111 mL/min/{1.73_m2} (ref >59)
GFR calc non Af Amer: 96 mL/min/{1.73_m2} (ref >59)
Glucose: 88 mg/dL (ref 65–99)
Phosphorus: 3.2 mg/dL (ref 3.0–4.3)
Potassium: 4.1 mmol/L (ref 3.5–5.2)
Sodium: 141 mmol/L (ref 134–144)

## 2018-09-30 LAB — HEPATIC FUNCTION PANEL
ALT: 24 IU/L (ref 0–32)
AST: 25 IU/L (ref 0–40)
Alkaline Phosphatase: 63 IU/L (ref 39–117)
Bilirubin Total: <0.2 mg/dL (ref 0.0–1.2)
Bilirubin, Direct: 0.08 mg/dL (ref 0.00–0.40)
Total Protein: 6.7 g/dL (ref 6.0–8.5)

## 2018-10-13 ENCOUNTER — Other Ambulatory Visit: Payer: Self-pay | Admitting: Family Medicine

## 2018-10-13 ENCOUNTER — Other Ambulatory Visit: Payer: Self-pay

## 2018-10-13 DIAGNOSIS — M1990 Unspecified osteoarthritis, unspecified site: Secondary | ICD-10-CM

## 2018-10-13 MED ORDER — MONTELUKAST SODIUM 10 MG PO TABS: 10.0000 mg | ORAL_TABLET | Freq: Every day | ORAL | 0 refills | Status: DC

## 2018-10-13 NOTE — Progress Notes (Unsigned)
Sent in Singulair

## 2018-11-04 ENCOUNTER — Other Ambulatory Visit: Payer: Self-pay | Admitting: Family Medicine

## 2018-11-18 ENCOUNTER — Other Ambulatory Visit: Payer: Self-pay | Admitting: Obstetrics and Gynecology

## 2018-11-18 DIAGNOSIS — Z1231 Encounter for screening mammogram for malignant neoplasm of breast: Secondary | ICD-10-CM

## 2018-11-19 ENCOUNTER — Other Ambulatory Visit: Payer: Self-pay

## 2018-11-19 ENCOUNTER — Ambulatory Visit (INDEPENDENT_AMBULATORY_CARE_PROVIDER_SITE_OTHER): Payer: BC Managed Care – PPO

## 2018-11-19 DIAGNOSIS — Z23 Encounter for immunization: Secondary | ICD-10-CM

## 2018-11-19 MED ORDER — SHINGRIX 50 MCG/0.5ML IM SUSR: 0.5000 mL | Freq: Once | INTRAMUSCULAR | 1 refills | Status: AC

## 2018-11-23 ENCOUNTER — Encounter (INDEPENDENT_AMBULATORY_CARE_PROVIDER_SITE_OTHER): Payer: Self-pay

## 2018-11-23 ENCOUNTER — Ambulatory Visit
Admission: RE | Admit: 2018-11-23 | Discharge: 2018-11-23 | Disposition: A | Discharge status: Home / Self Care | Payer: BC Managed Care – PPO | Source: Ambulatory Visit | Attending: Obstetrics and Gynecology | Admitting: Obstetrics and Gynecology

## 2018-11-23 ENCOUNTER — Other Ambulatory Visit: Payer: Self-pay

## 2018-11-23 DIAGNOSIS — Z1231 Encounter for screening mammogram for malignant neoplasm of breast: Secondary | ICD-10-CM | POA: Diagnosis not present

## 2019-01-26 ENCOUNTER — Ambulatory Visit (INDEPENDENT_AMBULATORY_CARE_PROVIDER_SITE_OTHER): Payer: BC Managed Care – PPO

## 2019-01-26 ENCOUNTER — Other Ambulatory Visit: Payer: Self-pay

## 2019-01-26 DIAGNOSIS — Z23 Encounter for immunization: Secondary | ICD-10-CM | POA: Diagnosis not present

## 2019-01-26 NOTE — Progress Notes (Signed)
shingrix 2 given

## 2019-01-31 ENCOUNTER — Other Ambulatory Visit: Payer: Self-pay | Admitting: Family Medicine

## 2019-02-02 ENCOUNTER — Ambulatory Visit: Payer: BC Managed Care – PPO

## 2019-03-30 ENCOUNTER — Ambulatory Visit: Payer: BC Managed Care – PPO | Admitting: Family Medicine

## 2019-03-30 ENCOUNTER — Other Ambulatory Visit: Payer: Self-pay

## 2019-03-30 ENCOUNTER — Encounter: Payer: Self-pay | Admitting: Family Medicine

## 2019-03-30 ENCOUNTER — Ambulatory Visit (INDEPENDENT_AMBULATORY_CARE_PROVIDER_SITE_OTHER): Payer: BC Managed Care – PPO | Admitting: Family Medicine

## 2019-03-30 VITALS — BP 130/80 | HR 60 | Ht 60.0 in | Wt 147.0 lb

## 2019-03-30 DIAGNOSIS — E7849 Other hyperlipidemia: Secondary | ICD-10-CM

## 2019-03-30 DIAGNOSIS — J301 Allergic rhinitis due to pollen: Secondary | ICD-10-CM | POA: Diagnosis not present

## 2019-03-30 DIAGNOSIS — J4520 Mild intermittent asthma, uncomplicated: Secondary | ICD-10-CM

## 2019-03-30 DIAGNOSIS — M1990 Unspecified osteoarthritis, unspecified site: Secondary | ICD-10-CM

## 2019-03-30 DIAGNOSIS — I10 Essential (primary) hypertension: Secondary | ICD-10-CM

## 2019-03-30 MED ORDER — MONTELUKAST SODIUM 10 MG PO TABS: ORAL_TABLET | ORAL | 1 refills | Status: DC

## 2019-03-30 MED ORDER — SIMVASTATIN 20 MG PO TABS: ORAL_TABLET | ORAL | 1 refills | Status: DC

## 2019-03-30 MED ORDER — MELOXICAM 15 MG PO TABS: 15.0000 mg | ORAL_TABLET | Freq: Every day | ORAL | 5 refills | Status: DC

## 2019-03-30 MED ORDER — LOSARTAN POTASSIUM-HCTZ 100-25 MG PO TABS: 1.0000 | ORAL_TABLET | Freq: Every day | ORAL | 1 refills | Status: DC

## 2019-03-30 MED ORDER — FLUTICASONE PROPIONATE 50 MCG/ACT NA SUSP: NASAL | 1 refills | Status: DC

## 2019-03-30 NOTE — Progress Notes (Signed)
Date:  03/30/2019   Name:  Kristin Mata   DOB:  1950/05/31   MRN:  [id number]   Chief Complaint: Hypertension, Hyperlipidemia, Allergic Rhinitis , and Arthritis  Hypertension This is a chronic problem. The current episode started more than 1 year ago. The problem has been gradually improving since onset. The problem is controlled. Pertinent negatives include no anxiety, blurred vision, chest pain, headaches, malaise/fatigue, neck pain, orthopnea, palpitations, peripheral edema, PND, shortness of breath or sweats. There are no associated agents to hypertension. Risk factors for coronary artery disease include diabetes mellitus and dyslipidemia. Past treatments include angiotensin blockers and diuretics. The current treatment provides mild improvement. There are no compliance problems.  There is no history of angina, kidney disease, CAD/MI, CVA, heart failure, left ventricular hypertrophy, PVD or retinopathy. There is no history of chronic renal disease, a hypertension causing med or renovascular disease.  Hyperlipidemia This is a chronic problem. The problem is controlled. Recent lipid tests were reviewed and are normal. She has no history of chronic renal disease. Factors aggravating her hyperlipidemia include thiazides. Pertinent negatives include no chest pain, focal sensory loss, focal weakness, leg pain, myalgias or shortness of breath. Current antihyperlipidemic treatment includes statins. The current treatment provides moderate improvement of lipids. There are no compliance problems.  Risk factors for coronary artery disease include dyslipidemia and hypertension.  Arthritis Presents for follow-up visit. She complains of pain and stiffness. Affected location: bilateral hands. Her pain is at a severity of 4/10. Pertinent negatives include no diarrhea, dry eyes, dry mouth, dysuria, fever, pain at night, pain while resting or rash. (Knotty")  URI  This is a chronic (for allergic rhinitis)  problem. The current episode started more than 1 year ago. The problem has been waxing and waning. There has been no fever. Associated symptoms include congestion and rhinorrhea. Pertinent negatives include no abdominal pain, chest pain, coughing, diarrhea, dysuria, ear pain, headaches, joint pain, joint swelling, nausea, neck pain, plugged ear sensation, rash, sinus pain, sneezing, sore throat, swollen glands, vomiting or wheezing. The treatment provided moderate relief.    Lab Results  Component Value Date   CREATININE 0.59 09/29/2018   BUN 13 09/29/2018   NA 141 09/29/2018   K 4.1 09/29/2018   CL 101 09/29/2018   CO2 27 09/29/2018   Lab Results  Component Value Date   CHOL 156 09/29/2018   HDL 55 09/29/2018   LDLCALC 86 09/29/2018   TRIG 77 09/29/2018   CHOLHDL 3.1 08/15/2017   No results found for: TSH No results found for: HGBA1C   Review of Systems  Constitutional: Negative for chills, fever and malaise/fatigue.  HENT: Positive for congestion and rhinorrhea. Negative for drooling, ear discharge, ear pain, sinus pain, sneezing and sore throat.   Eyes: Negative for blurred vision.  Respiratory: Negative for cough, shortness of breath and wheezing.   Cardiovascular: Negative for chest pain, palpitations, orthopnea, leg swelling and PND.  Gastrointestinal: Negative for abdominal pain, blood in stool, constipation, diarrhea, nausea and vomiting.  Endocrine: Negative for polydipsia.  Genitourinary: Negative for dysuria, frequency, hematuria and urgency.  Musculoskeletal: Positive for arthritis and stiffness. Negative for back pain, joint pain, myalgias and neck pain.  Skin: Negative for rash.  Allergic/Immunologic: Negative for environmental allergies.  Neurological: Negative for dizziness, focal weakness and headaches.  Hematological: Does not bruise/bleed easily.  Psychiatric/Behavioral: Negative for suicidal ideas. The patient is not nervous/anxious.     Patient Active  Problem List   Diagnosis Date Noted  .  Essential hypertension 10/31/2016  . 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 H/O: HTN (hypertension) 06/07/2014  . Asthma, mild intermittent 06/07/2014    No Known Allergies  Past Surgical History:  Procedure Laterality Date  . ABDOMINAL HYSTERECTOMY    . BREAST CYST ASPIRATION Bilateral    neg  . HAND SURGERY      Social History   Tobacco Use  . Smoking status: Never Smoker  . Smokeless tobacco: Never Used  Substance Use Topics  . Alcohol use: Yes    Alcohol/week: 0.0 standard drinks  . Drug use: No     Medication list has been reviewed and updated.  Current Meds  Medication Sig  . albuterol (PROVENTIL HFA;VENTOLIN HFA) 108 (90 Base) MCG/ACT inhaler [name]  . ascorbic acid (VITAMIN C) 250 MG tablet Take 1 tablet by mouth daily.  Marland Kitchen aspirin EC 81 MG tablet Take 81 mg by mouth daily.  . Biotin 10 MG TABS Take 1 tablet by mouth daily.  . Cholecalciferol (VITAMIN D PO) Take by mouth.  . fluticasone (FLONASE) 50 MCG/ACT nasal spray SPRAY 2 SPRAYS INTO EACH NOSTRIL EVERY DAY  . Fluticasone-Salmeterol (ADVAIR) 250-50 MCG/DOSE AEPB Inhale 1 puff into the lungs daily. Dr [name]  . gabapentin (NEURONTIN) 100 MG capsule Take 1 capsule (100 mg total) by mouth 3 (three) times daily. Dr Burt Ek  . losartan-hydrochlorothiazide (HYZAAR) 100-25 MG tablet Take 1 tablet by mouth daily.  . meloxicam (MOBIC) 15 MG tablet TAKE 1 TABLET BY MOUTH EVERY DAY  . montelukast (SINGULAIR) 10 MG tablet TAKE 1 TABLET BY MOUTH EVERYDAY AT BEDTIME  . Omega-3 Fatty Acids (FISH OIL) 1000 MG CAPS Take 1 capsule by mouth 2 (two) times daily.  Marland Kitchen omeprazole (PRILOSEC) 10 MG capsule Take 1 capsule by mouth daily.  . simvastatin (ZOCOR) 20 MG tablet One a day  . vitamin E 1000 UNIT capsule Take 1 capsule by mouth daily.    PHQ 2/9 Scores 09/29/2018  10/31/2016 07/18/2015 06/09/2015  PHQ - 2 Score 0 0 0 0  PHQ- 9 Score 0 - - -    BP Readings from Last 3 Encounters:  03/30/19 130/80  09/29/18 120/70  04/16/18 130/80    Physical Exam Vitals and nursing note reviewed.  Constitutional:      Appearance: She is well-developed.  HENT:     Head: Normocephalic.     Right Ear: Tympanic membrane, ear canal and external ear normal.     Left Ear: Tympanic membrane, ear canal and external ear normal.     Nose: Nose normal.     Mouth/Throat:     Mouth: Mucous membranes are moist.  Eyes:     General: Lids are everted, no foreign bodies appreciated. No scleral icterus.       Left eye: No foreign body or hordeolum.     Conjunctiva/sclera: Conjunctivae normal.     Right eye: Right conjunctiva is not injected.     Left eye: Left conjunctiva is not injected.     Pupils: Pupils are equal, round, and reactive to light.  Neck:     Thyroid: No thyromegaly.     Vascular: No JVD.     Trachea: No tracheal deviation.  Cardiovascular:     Rate and Rhythm: Normal rate and regular rhythm.     Heart sounds: Normal heart sounds. No murmur. No friction rub. No gallop.   Pulmonary:     Effort: Pulmonary  effort is normal. No respiratory distress.     Breath sounds: Normal breath sounds. No wheezing, rhonchi or rales.  Abdominal:     General: Bowel sounds are normal.     Palpations: Abdomen is soft. There is no mass.     Tenderness: There is no abdominal tenderness. There is no right CVA tenderness, left CVA tenderness, guarding or rebound.  Musculoskeletal:        General: No tenderness or deformity. Normal range of motion.     Cervical back: Normal range of motion and neck supple.  Lymphadenopathy:     Cervical: No cervical adenopathy.  Skin:    General: Skin is warm.     Capillary Refill: Capillary refill takes less than 2 seconds.     Findings: No rash.  Neurological:     Mental Status: She is alert and oriented to person, place, and time.      Cranial Nerves: No cranial nerve deficit.     Deep Tendon Reflexes: Reflexes normal.  Psychiatric:        Mood and Affect: Mood is not anxious or depressed.     Wt Readings from Last 3 Encounters:  03/30/19 147 lb (66.7 kg)  09/29/18 145 lb (65.8 kg)  04/16/18 148 lb (67.1 kg)    BP 130/80   Pulse 60   Ht 5' (1.524 m)   Wt 147 lb (66.7 kg)   BMI 28.71 kg/m   Assessment and Plan: 1. Essential hypertension Chronic.  Controlled.  Stable.  Continue losartan hydrochlorothiazide 100-25 mg daily.  Reviewed previous renal panel in which is acceptable and will not repeat at this time - losartan-hydrochlorothiazide (HYZAAR) 100-25 MG tablet; Take 1 tablet by mouth daily.  Dispense: 90 tablet; Refill: 1  2. Familial multiple lipoprotein-type hyperlipidemia Chronic.  Controlled.  Stable.  Continue simvastatin 20 mg once a day.  Reviewed previous lipid panel which is in acceptable range and we will not check at this time - simvastatin (ZOCOR) 20 MG tablet; One a day  Dispense: 90 tablet; Refill: 1  3. Seasonal allergic rhinitis due to pollen .  Controlled.  Episodic.  Patient will continue fluticasone nasal spray and Singulair 1 tablet daily particularly during the pollen season. - fluticasone (FLONASE) 50 MCG/ACT nasal spray; SPRAY 2 SPRAYS INTO EACH NOSTRIL EVERY DAY  Dispense: 16 g; Refill: 1 - montelukast (SINGULAIR) 10 MG tablet; TAKE 1 TABLET BY MOUTH EVERYDAY AT BEDTIME  Dispense: 90 tablet; Refill: 1  4. Mild intermittent asthma without complication Chronic.  Controlled.  Stable.  Continue Singulair 10 mg daily for control of bronchospasm. - montelukast (SINGULAIR) 10 MG tablet; TAKE 1 TABLET BY MOUTH EVERYDAY AT BEDTIME  Dispense: 90 tablet; Refill: 1  5. Arthritis Has continued discomfort in both hands with swelling and limited range of motion.  We will continue meloxicam 15 mg once a day on an as-needed basis. - meloxicam (MOBIC) 15 MG tablet; Take 1 tablet (15 mg total) by  mouth daily.  Dispense: 30 tablet; Refill: 5

## 2019-04-19 ENCOUNTER — Other Ambulatory Visit: Payer: Self-pay | Admitting: Family Medicine

## 2019-04-19 ENCOUNTER — Other Ambulatory Visit: Payer: Self-pay

## 2019-04-19 DIAGNOSIS — G620 Drug-induced polyneuropathy: Secondary | ICD-10-CM

## 2019-04-22 ENCOUNTER — Other Ambulatory Visit: Payer: Self-pay | Admitting: Family Medicine

## 2019-04-22 DIAGNOSIS — T451X5A Adverse effect of antineoplastic and immunosuppressive drugs, initial encounter: Secondary | ICD-10-CM

## 2019-04-22 DIAGNOSIS — G620 Drug-induced polyneuropathy: Secondary | ICD-10-CM

## 2019-06-23 ENCOUNTER — Other Ambulatory Visit: Payer: Self-pay | Admitting: Family Medicine

## 2019-06-23 DIAGNOSIS — J301 Allergic rhinitis due to pollen: Secondary | ICD-10-CM

## 2019-06-23 NOTE — Telephone Encounter (Signed)
Requested Prescriptions  Pending Prescriptions Disp Refills  . fluticasone (FLONASE) 50 MCG/ACT nasal spray [Pharmacy Med Name: FLUTICASONE PROP 50 MCG SPRAY] 16 g 3    Sig: SPRAY 2 SPRAYS INTO EACH NOSTRIL EVERY DAY     Ear, Nose, and Throat: Nasal Preparations - Corticosteroids Passed - 06/23/2019  1:22 AM      Passed - Valid encounter within last 12 months    Recent Outpatient Visits          2 months ago Essential hypertension   [facility] Juline Patch, MD   8 months ago Essential hypertension   [facility] Juline Patch, MD   1 year ago H/O: HTN (hypertension)   [facility] Juline Patch, MD   1 year ago H/O: HTN (hypertension)   [facility] Juline Patch, MD   2 years ago Acute non-recurrent maxillary sinusitis   [facility] Juline Patch, MD      Future Appointments            In 3 months Juline Patch, MD [facility], [facility]

## 2019-08-24 ENCOUNTER — Other Ambulatory Visit: Payer: Self-pay | Admitting: Family Medicine

## 2019-08-24 DIAGNOSIS — T451X5A Adverse effect of antineoplastic and immunosuppressive drugs, initial encounter: Secondary | ICD-10-CM

## 2019-09-16 ENCOUNTER — Other Ambulatory Visit: Payer: Self-pay | Admitting: Family Medicine

## 2019-09-16 DIAGNOSIS — J301 Allergic rhinitis due to pollen: Secondary | ICD-10-CM

## 2019-09-16 NOTE — Telephone Encounter (Signed)
Requested Prescriptions  Pending Prescriptions Disp Refills  . fluticasone (FLONASE) 50 MCG/ACT nasal spray [Pharmacy Med Name: FLUTICASONE PROP 50 MCG SPRAY] 48 mL 1    Sig: SPRAY 2 SPRAYS INTO EACH NOSTRIL EVERY DAY     Ear, Nose, and Throat: Nasal Preparations - Corticosteroids Passed - 09/16/2019  1:45 AM      Passed - Valid encounter within last 12 months    Recent Outpatient Visits          5 months ago Essential hypertension   [facility] Juline Patch, MD   11 months ago Essential hypertension   [place], [place]   1 year ago H/O: HTN (hypertension)   [facility] Juline Patch, MD   2 years ago H/O: HTN (hypertension)   [facility] Juline Patch, MD   2 years ago Acute non-recurrent maxillary sinusitis   [facility] Juline Patch, MD      Future Appointments            In 2 weeks Juline Patch, MD [facility], [facility]

## 2019-09-26 ENCOUNTER — Other Ambulatory Visit: Payer: Self-pay | Admitting: Family Medicine

## 2019-09-26 DIAGNOSIS — I10 Essential (primary) hypertension: Secondary | ICD-10-CM

## 2019-09-27 ENCOUNTER — Other Ambulatory Visit: Payer: Self-pay | Admitting: Family Medicine

## 2019-09-27 DIAGNOSIS — E7849 Other hyperlipidemia: Secondary | ICD-10-CM

## 2019-09-27 NOTE — Telephone Encounter (Signed)
Requested medication (s) are due for refill today: yes  Requested medication (s) are on the active medication list: yes  Last refill:  03/30/19  #90 1 refill  Future visit scheduled:yes  Notes to clinic: Labs Out of date    Requested Prescriptions  Pending Prescriptions Disp Refills   losartan-hydrochlorothiazide (HYZAAR) 100-25 MG tablet [Pharmacy Med Name: LOSARTAN-HCTZ 100-25 MG TAB] 90 tablet 1    Sig: TAKE 1 TABLET BY MOUTH EVERY DAY      Cardiovascular: ARB + Diuretic Combos Failed - 09/26/2019  9:15 AM      Failed - K in normal range and within 180 days    Potassium  Date Value Ref Range Status  09/29/2018 4.1 3.5 - 5.2 mmol/L Final  03/11/2013 3.6 3.5 - 5.1 mmol/L Final          Failed - Na in normal range and within 180 days    Sodium  Date Value Ref Range Status  09/29/2018 141 134 - 144 mmol/L Final  03/11/2013 134 (L) 136 - 145 mmol/L Final          Failed - Cr in normal range and within 180 days    Creatinine  Date Value Ref Range Status  03/11/2013 0.68 0.60 - 1.30 mg/dL Final   Creatinine, Ser  Date Value Ref Range Status  09/29/2018 0.59 0.57 - 1.00 mg/dL Final          Failed - Ca in normal range and within 180 days    Calcium  Date Value Ref Range Status  09/29/2018 9.3 8.7 - 10.3 mg/dL Final   Calcium, Total  Date Value Ref Range Status  03/11/2013 8.2 (L) 8.5 - 10.1 mg/dL Final          Passed - Patient is not pregnant      Passed - Last BP in normal range    BP Readings from Last 1 Encounters:  03/30/19 130/80          Passed - Valid encounter within last 6 months    Recent Outpatient Visits           6 months ago Essential hypertension   [facility] Juline Patch, MD   12 months ago Essential hypertension   [facility] Juline Patch, MD   1 year ago H/O: HTN (hypertension)   [facility] Juline Patch, MD   2 years ago H/O: HTN (hypertension)   [facility] Juline Patch, MD    2 years ago Acute non-recurrent maxillary sinusitis   [facility] Juline Patch, MD       Future Appointments             In 3 days Juline Patch, MD [facility], [facility]

## 2019-09-29 ENCOUNTER — Other Ambulatory Visit: Payer: Self-pay | Admitting: Family Medicine

## 2019-09-29 DIAGNOSIS — M1990 Unspecified osteoarthritis, unspecified site: Secondary | ICD-10-CM

## 2019-09-29 DIAGNOSIS — G620 Drug-induced polyneuropathy: Secondary | ICD-10-CM

## 2019-09-30 ENCOUNTER — Ambulatory Visit (INDEPENDENT_AMBULATORY_CARE_PROVIDER_SITE_OTHER): Payer: Medicare PPO | Admitting: Family Medicine

## 2019-09-30 ENCOUNTER — Other Ambulatory Visit: Payer: Self-pay

## 2019-09-30 ENCOUNTER — Encounter: Payer: Self-pay | Admitting: Family Medicine

## 2019-09-30 VITALS — BP 130/80 | HR 60 | Ht 60.0 in | Wt 143.0 lb

## 2019-09-30 DIAGNOSIS — I10 Essential (primary) hypertension: Secondary | ICD-10-CM

## 2019-09-30 DIAGNOSIS — J301 Allergic rhinitis due to pollen: Secondary | ICD-10-CM

## 2019-09-30 DIAGNOSIS — M1990 Unspecified osteoarthritis, unspecified site: Secondary | ICD-10-CM | POA: Diagnosis not present

## 2019-09-30 DIAGNOSIS — G629 Polyneuropathy, unspecified: Secondary | ICD-10-CM

## 2019-09-30 DIAGNOSIS — E782 Mixed hyperlipidemia: Secondary | ICD-10-CM

## 2019-09-30 DIAGNOSIS — G2581 Restless legs syndrome: Secondary | ICD-10-CM | POA: Diagnosis not present

## 2019-09-30 DIAGNOSIS — J4520 Mild intermittent asthma, uncomplicated: Secondary | ICD-10-CM

## 2019-09-30 MED ORDER — MONTELUKAST SODIUM 10 MG PO TABS: ORAL_TABLET | ORAL | 1 refills | Status: DC

## 2019-09-30 MED ORDER — FLUTICASONE PROPIONATE 50 MCG/ACT NA SUSP: NASAL | 1 refills | Status: DC

## 2019-09-30 MED ORDER — PRAMIPEXOLE DIHYDROCHLORIDE 0.125 MG PO TABS: 0.1250 mg | ORAL_TABLET | Freq: Every evening | ORAL | 1 refills | Status: DC

## 2019-09-30 NOTE — Progress Notes (Signed)
Date:  09/30/2019   Name:  Kristin Mata   DOB:  12/01/1950   MRN:  [id number]   Chief Complaint: Allergic Rhinitis , Hypertension, Hyperlipidemia, and Asthma  Hypertension This is a chronic problem. The current episode started more than 1 year ago. The problem has been gradually improving since onset. The problem is controlled. Pertinent negatives include no anxiety, blurred vision, chest pain, headaches, malaise/fatigue, neck pain, orthopnea, palpitations, peripheral edema, PND, shortness of breath or sweats. There are no associated agents to hypertension. There are no known risk factors for coronary artery disease. Past treatments include angiotensin blockers and diuretics. The current treatment provides mild improvement. There are no compliance problems.  There is no history of angina, kidney disease, CAD/MI, CVA, heart failure, left ventricular hypertrophy, PVD or retinopathy. There is no history of chronic renal disease, a hypertension causing med or renovascular disease.  Hyperlipidemia This is a chronic problem. The current episode started more than 1 year ago. The problem is controlled. Recent lipid tests were reviewed and are normal. She has no history of chronic renal disease, diabetes, hypothyroidism, liver disease, obesity or nephrotic syndrome. There are no known factors aggravating her hyperlipidemia. Pertinent negatives include no chest pain, focal sensory loss, focal weakness, leg pain, myalgias or shortness of breath. Current antihyperlipidemic treatment includes statins. There are no compliance problems.   Asthma There is no chest tightness, cough, difficulty breathing, frequent throat clearing, hemoptysis, hoarse voice, shortness of breath, sputum production or wheezing. Pertinent negatives include no appetite change, chest pain, ear pain, fever, headaches, heartburn, malaise/fatigue, myalgias, PND, rhinorrhea, sneezing, sore throat or sweats. Her symptoms are aggravated by  pollen. Her symptoms are alleviated by beta-agonist and steroid inhaler. She reports moderate improvement on treatment. Her past medical history is significant for asthma.  Neurologic Problem The patient's pertinent negatives include no altered mental status, clumsiness, focal sensory loss, focal weakness, loss of balance, memory loss, near-syncope, slurred speech, syncope, visual change or weakness. Primary symptoms comment: restless leg sxs. The problem is unchanged. Pertinent negatives include no abdominal pain, auditory change, aura, back pain, chest pain, confusion, diaphoresis, dizziness, fatigue, fever, headaches, light-headedness, nausea, neck pain, palpitations, shortness of breath or vomiting. The treatment provided mild relief. There is no history of a bleeding disorder, a clotting disorder, a CVA, dementia, head trauma, liver disease, mood changes or seizures.    Lab Results  Component Value Date   CREATININE 0.59 09/29/2018   BUN 13 09/29/2018   NA 141 09/29/2018   K 4.1 09/29/2018   CL 101 09/29/2018   CO2 27 09/29/2018   Lab Results  Component Value Date   CHOL 156 09/29/2018   HDL 55 09/29/2018   LDLCALC 86 09/29/2018   TRIG 77 09/29/2018   CHOLHDL 3.1 08/15/2017   No results found for: TSH No results found for: HGBA1C Lab Results  Component Value Date   WBC 8.4 03/11/2013   HGB 8.2 (L) 03/12/2013   HCT 21.5 (L) 03/11/2013   MCV 85 03/11/2013   PLT 395 03/11/2013   Lab Results  Component Value Date   ALT 24 09/29/2018   AST 25 09/29/2018   ALKPHOS 63 09/29/2018   BILITOT <0.2 09/29/2018     Review of Systems  Constitutional: Negative.  Negative for appetite change, chills, diaphoresis, fatigue, fever, malaise/fatigue and unexpected weight change.  HENT: Negative for congestion, ear discharge, ear pain, hoarse voice, rhinorrhea, sinus pressure, sneezing and sore throat.   Eyes: Negative for blurred vision,  photophobia, pain, discharge, redness and itching.   Respiratory: Negative for cough, hemoptysis, sputum production, shortness of breath, wheezing and stridor.   Cardiovascular: Negative for chest pain, palpitations, orthopnea, PND and near-syncope.  Gastrointestinal: Negative for abdominal pain, blood in stool, constipation, diarrhea, heartburn, nausea and vomiting.  Endocrine: Negative for cold intolerance, heat intolerance, polydipsia, polyphagia and polyuria.  Genitourinary: Negative for dysuria, flank pain, frequency, hematuria, menstrual problem, pelvic pain, urgency, vaginal bleeding and vaginal discharge.  Musculoskeletal: Negative for arthralgias, back pain, myalgias and neck pain.  Skin: Negative for rash.  Allergic/Immunologic: Negative for environmental allergies and food allergies.  Neurological: Negative for dizziness, focal weakness, syncope, weakness, light-headedness, numbness, headaches and loss of balance.  Hematological: Negative for adenopathy. Does not bruise/bleed easily.  Psychiatric/Behavioral: Negative for confusion, dysphoric mood and memory loss. The patient is not nervous/anxious.     Patient Active Problem List   Diagnosis Date Noted  . Essential hypertension 10/31/2016  . Moderate asthma without complication 89/16/9450  . 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 H/O: HTN (hypertension) 06/07/2014  . Asthma, mild intermittent 06/07/2014    No Known Allergies  Past Surgical History:  Procedure Laterality Date  . ABDOMINAL HYSTERECTOMY    . BREAST CYST ASPIRATION Bilateral    neg  . HAND SURGERY      Social History   Tobacco Use  . Smoking status: Never Smoker  . Smokeless tobacco: Never Used  Substance Use Topics  . Alcohol use: Yes    Alcohol/week: 0.0 standard drinks  . Drug use: No     Medication list has been reviewed and updated.  Current Meds  Medication Sig  . albuterol (PROVENTIL HFA;VENTOLIN HFA) 108 (90 Base) MCG/ACT  inhaler [name]  . ascorbic acid (VITAMIN C) 250 MG tablet Take 1 tablet by mouth daily.  Marland Kitchen aspirin EC 81 MG tablet Take 81 mg by mouth daily.  . Biotin 10 MG TABS Take 1 tablet by mouth daily.  . Cholecalciferol (VITAMIN D PO) Take by mouth.  Marland Kitchen CINNAMON PO Take 1 capsule by mouth daily.  . fexofenadine (ALLEGRA) 180 MG tablet Take 1 tablet (180 mg total) by mouth daily.  . fluticasone (FLONASE) 50 MCG/ACT nasal spray SPRAY 2 SPRAYS INTO EACH NOSTRIL EVERY DAY  . Fluticasone-Salmeterol (ADVAIR) 250-50 MCG/DOSE AEPB Inhale 1 puff into the lungs daily. Dr [name]  . gabapentin (NEURONTIN) 100 MG capsule TAKE 1 CAPSULE BY MOUTH THREE TIMES A DAY  . losartan-hydrochlorothiazide (HYZAAR) 100-25 MG tablet TAKE 1 TABLET BY MOUTH EVERY DAY  . meloxicam (MOBIC) 15 MG tablet TAKE 1 TABLET BY MOUTH EVERY DAY  . montelukast (SINGULAIR) 10 MG tablet TAKE 1 TABLET BY MOUTH EVERYDAY AT BEDTIME  . Omega-3 Fatty Acids (FISH OIL) 1000 MG CAPS Take 1 capsule by mouth 2 (two) times daily.  Marland Kitchen omeprazole (PRILOSEC) 10 MG capsule Take 1 capsule by mouth daily.  . simvastatin (ZOCOR) 20 MG tablet TAKE 1 TABLET BY MOUTH EVERY DAY  . vitamin E 1000 UNIT capsule Take 1 capsule by mouth daily.    PHQ 2/9 Scores 09/29/2018 10/31/2016 07/18/2015 06/09/2015  PHQ - 2 Score 0 0 0 0  PHQ- 9 Score 0 - - -    No flowsheet data found.  BP Readings from Last 3 Encounters:  09/30/19 130/80  03/30/19 130/80  09/29/18 120/70    Physical Exam Vitals and nursing note reviewed.  Constitutional:      General: She is not in acute  distress.    Appearance: She is not diaphoretic.  HENT:     Head: Normocephalic and atraumatic.     Right Ear: External ear normal.     Left Ear: External ear normal.     Nose: Nose normal.  Eyes:     General:        Right eye: No discharge.        Left eye: No discharge.     Conjunctiva/sclera: Conjunctivae normal.     Pupils: Pupils are equal, round, and reactive to light.  Neck:      Thyroid: No thyromegaly.     Vascular: No JVD.  Cardiovascular:     Rate and Rhythm: Normal rate and regular rhythm.     Heart sounds: Normal heart sounds. No murmur heard.  No friction rub. No gallop.   Pulmonary:     Effort: Pulmonary effort is normal.     Breath sounds: Normal breath sounds.  Abdominal:     General: Bowel sounds are normal.     Palpations: Abdomen is soft. There is no mass.     Tenderness: There is no abdominal tenderness. There is no guarding.  Musculoskeletal:        General: Normal range of motion.     Cervical back: Normal range of motion and neck supple.  Lymphadenopathy:     Cervical: No cervical adenopathy.  Skin:    General: Skin is warm and dry.  Neurological:     Mental Status: She is alert.     Deep Tendon Reflexes: Reflexes are normal and symmetric.     Wt Readings from Last 3 Encounters:  09/30/19 143 lb (64.9 kg)  03/30/19 147 lb (66.7 kg)  09/29/18 145 lb (65.8 kg)    BP 130/80   Pulse 60   Ht 5' (1.524 m)   Wt 143 lb (64.9 kg)   BMI 27.93 kg/m   Assessment and Plan: 1. Seasonal allergic rhinitis due to pollen Chronic.  Controlled.  Stable.  Continue fluticasone nasal spray as well as Singulair 10 mg once a day. - fluticasone (FLONASE) 50 MCG/ACT nasal spray; One puff each nostril q day  Dispense: 48 mL; Refill: 1 - montelukast (SINGULAIR) 10 MG tablet; TAKE 1 TABLET BY MOUTH EVERYDAY AT BEDTIME  Dispense: 90 tablet; Refill: 1  2. Essential hypertension Chronic controlled.  Stable.  Continue losartan hydrochlorothiazide 100-25 mg once a day.  Will check CMP and lipid panel. - Comprehensive Metabolic Panel (CMET) - Lipid Panel With LDL/HDL Ratio  3. Arthritis Chronic.  Controlled.  Stable.  Patient may take Tylenol on a as needed basis as needed.  4. Mild intermittent asthma without complication Chronic.   Mild and intermittent.  Continue Singulair 10 mg daily.  Patient will also continue Advair as well as albuterol inhaler. -  montelukast (SINGULAIR) 10 MG tablet; TAKE 1 TABLET BY MOUTH EVERYDAY AT BEDTIME  Dispense: 90 tablet; Refill: 1  5. Restless leg syndrome New onset.  Patient is having focal sensory concerns in the lower legs at night.  Patient has been on gabapentin in the past but this seems to be worsening.  We will trial Mirapex because it sounds like it may be more of a restless leg but until then consider getting an neurology consult for more formal evaluation of this neuropathy which may require more Annoyed involvement. - pramipexole (MIRAPEX) 0.125 MG tablet; Take 1 tablet (0.125 mg total) by mouth at bedtime.  Dispense: 30 tablet; Refill: 1  6. Neuropathy  Chronic.  Gradually worsening.  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  This is been controlled on 100 mg at night gabapentin.  Patient has concerned that it may need to have more medication involved.  As noted above we can retry some Mirapex first but will obtain a neurology consult.  Patient has also been having some paresthesias in the left hand. - Ambulatory referral to Neurology  7. Mixed hyperlipidemia Chronic.  Controlled.  Stable.  Continue simvastatin 20 mg once a day.  Will check lipid panel for current stability. - Lipid Panel With LDL/HDL Ratio

## 2019-10-01 LAB — COMPREHENSIVE METABOLIC PANEL
ALT: 25 IU/L (ref 0–32)
AST: 21 IU/L (ref 0–40)
Albumin/Globulin Ratio: 2 (ref 1.2–2.2)
Albumin: 4.5 g/dL (ref 3.8–4.8)
Alkaline Phosphatase: 58 IU/L (ref 48–121)
BUN/Creatinine Ratio: 25 (ref 12–28)
BUN: 14 mg/dL (ref 8–27)
Bilirubin Total: 0.2 mg/dL (ref 0.0–1.2)
CO2: 25 mmol/L (ref 20–29)
Calcium: 9.4 mg/dL (ref 8.7–10.3)
Chloride: 101 mmol/L (ref 96–106)
Creatinine, Ser: 0.57 mg/dL (ref 0.57–1.00)
GFR calc Af Amer: 112 mL/min/{1.73_m2} (ref >59)
GFR calc non Af Amer: 97 mL/min/{1.73_m2} (ref >59)
Globulin, Total: 2.2 g/dL (ref 1.5–4.5)
Glucose: 92 mg/dL (ref 65–99)
Potassium: 4.4 mmol/L (ref 3.5–5.2)
Sodium: 140 mmol/L (ref 134–144)
Total Protein: 6.7 g/dL (ref 6.0–8.5)

## 2019-10-01 LAB — LIPID PANEL WITH LDL/HDL RATIO
Cholesterol, Total: 170 mg/dL (ref 100–199)
HDL: 59 mg/dL (ref >39)
LDL Chol Calc (NIH): 99 mg/dL (ref 0–99)
LDL/HDL Ratio: 1.7 ratio (ref 0.0–3.2)
Triglycerides: 63 mg/dL (ref 0–149)
VLDL Cholesterol Cal: 12 mg/dL (ref 5–40)

## 2019-10-12 ENCOUNTER — Encounter: Payer: Self-pay | Admitting: Ophthalmology

## 2019-10-12 ENCOUNTER — Other Ambulatory Visit: Payer: Self-pay

## 2019-10-14 ENCOUNTER — Other Ambulatory Visit
Admission: RE | Admit: 2019-10-14 | Discharge: 2019-10-14 | Disposition: A | Discharge status: Home / Self Care | Payer: Medicare PPO | Source: Ambulatory Visit | Attending: Ophthalmology | Admitting: Ophthalmology

## 2019-10-14 ENCOUNTER — Other Ambulatory Visit: Payer: Self-pay

## 2019-10-14 DIAGNOSIS — Z20822 Contact with and (suspected) exposure to covid-19: Secondary | ICD-10-CM | POA: Diagnosis not present

## 2019-10-14 DIAGNOSIS — Z01812 Encounter for preprocedural laboratory examination: Secondary | ICD-10-CM | POA: Insufficient documentation

## 2019-10-14 NOTE — Discharge Instructions (Signed)

## 2019-10-15 LAB — SARS CORONAVIRUS 2 (TAT 6-24 HRS): SARS Coronavirus 2: NEGATIVE

## 2019-10-18 ENCOUNTER — Other Ambulatory Visit: Payer: Self-pay

## 2019-10-18 ENCOUNTER — Ambulatory Visit
Admission: RE | Admit: 2019-10-18 | Discharge: 2019-10-18 | Disposition: A | Discharge status: Home / Self Care | Payer: Medicare PPO | Attending: Ophthalmology | Admitting: Ophthalmology

## 2019-10-18 ENCOUNTER — Ambulatory Visit: Payer: Medicare PPO | Admitting: Anesthesiology

## 2019-10-18 ENCOUNTER — Encounter: Payer: Self-pay | Admitting: Ophthalmology

## 2019-10-18 ENCOUNTER — Encounter
Admission: RE | Disposition: A | Discharge status: Home / Self Care | Payer: Self-pay | Source: Home / Self Care | Attending: Ophthalmology

## 2019-10-18 DIAGNOSIS — Z79899 Other long term (current) drug therapy: Secondary | ICD-10-CM | POA: Insufficient documentation

## 2019-10-18 DIAGNOSIS — E7800 Pure hypercholesterolemia, unspecified: Secondary | ICD-10-CM | POA: Diagnosis not present

## 2019-10-18 DIAGNOSIS — Z85038 Personal history of other malignant neoplasm of large intestine: Secondary | ICD-10-CM | POA: Insufficient documentation

## 2019-10-18 DIAGNOSIS — Z791 Long term (current) use of non-steroidal anti-inflammatories (NSAID): Secondary | ICD-10-CM | POA: Insufficient documentation

## 2019-10-18 DIAGNOSIS — Z7982 Long term (current) use of aspirin: Secondary | ICD-10-CM | POA: Insufficient documentation

## 2019-10-18 DIAGNOSIS — H2511 Age-related nuclear cataract, right eye: Secondary | ICD-10-CM | POA: Diagnosis present

## 2019-10-18 DIAGNOSIS — I10 Essential (primary) hypertension: Secondary | ICD-10-CM | POA: Insufficient documentation

## 2019-10-18 DIAGNOSIS — G629 Polyneuropathy, unspecified: Secondary | ICD-10-CM | POA: Diagnosis not present

## 2019-10-18 DIAGNOSIS — J449 Chronic obstructive pulmonary disease, unspecified: Secondary | ICD-10-CM | POA: Insufficient documentation

## 2019-10-18 DIAGNOSIS — G2581 Restless legs syndrome: Secondary | ICD-10-CM | POA: Insufficient documentation

## 2019-10-18 HISTORY — DX: Other intervertebral disc degeneration, lumbar region: M51.36

## 2019-10-18 HISTORY — DX: Restless legs syndrome: G25.81

## 2019-10-18 HISTORY — DX: Unspecified osteoarthritis, unspecified site: M19.90

## 2019-10-18 HISTORY — PX: CATARACT EXTRACTION W/PHACO: SHX586

## 2019-10-18 HISTORY — DX: Polyneuropathy, unspecified: G62.9

## 2019-10-18 HISTORY — DX: Unspecified asthma, uncomplicated: J45.909

## 2019-10-18 SURGERY — PHACOEMULSIFICATION, CATARACT, WITH IOL INSERTION
Anesthesia: Monitor Anesthesia Care | Site: Eye | Laterality: Right

## 2019-10-18 MED ORDER — MOXIFLOXACIN HCL 0.5 % OP SOLN
OPHTHALMIC | Status: DC | PRN
  Administered 2019-10-18: 0.2 mL via OPHTHALMIC

## 2019-10-18 MED ORDER — MIDAZOLAM HCL 2 MG/2ML IJ SOLN
INTRAMUSCULAR | Status: DC | PRN
  Administered 2019-10-18 (×2): 1 mg via INTRAVENOUS

## 2019-10-18 MED ORDER — ACETAMINOPHEN 160 MG/5ML PO SOLN: 325.0000 mg | ORAL | Status: DC | PRN

## 2019-10-18 MED ORDER — SODIUM HYALURONATE 10 MG/ML IO SOLN
INTRAOCULAR | Status: DC | PRN
  Administered 2019-10-18: 0.55 mL via INTRAOCULAR

## 2019-10-18 MED ORDER — FENTANYL CITRATE (PF) 100 MCG/2ML IJ SOLN
INTRAMUSCULAR | Status: DC | PRN
Start: 2019-10-18 — End: 2019-10-18
  Administered 2019-10-18: 50 ug via INTRAVENOUS

## 2019-10-18 MED ORDER — LIDOCAINE HCL (PF) 2 % IJ SOLN
INTRAOCULAR | Status: DC | PRN
  Administered 2019-10-18: 1 mL via INTRAOCULAR

## 2019-10-18 MED ORDER — ACETAMINOPHEN 325 MG PO TABS: 325.0000 mg | ORAL_TABLET | ORAL | Status: DC | PRN

## 2019-10-18 MED ORDER — EPINEPHRINE PF 1 MG/ML IJ SOLN
INTRAOCULAR | Status: DC | PRN
  Administered 2019-10-18: 59 mL via OPHTHALMIC

## 2019-10-18 MED ORDER — TETRACAINE HCL 0.5 % OP SOLN
1.0000 [drp] | OPHTHALMIC | Status: DC | PRN
  Administered 2019-10-18 (×3): 1 [drp] via OPHTHALMIC

## 2019-10-18 MED ORDER — SODIUM HYALURONATE 23 MG/ML IO SOLN
INTRAOCULAR | Status: DC | PRN
  Administered 2019-10-18: 0.6 mL via INTRAOCULAR

## 2019-10-18 MED ORDER — ARMC OPHTHALMIC DILATING DROPS
1.0000 "application " | OPHTHALMIC | Status: DC | PRN
  Administered 2019-10-18 (×3): 1 via OPHTHALMIC

## 2019-10-18 MED ORDER — LACTATED RINGERS IV SOLN: INTRAVENOUS | Status: DC

## 2019-10-18 SURGICAL SUPPLY — 19 items
CANNULA ANT/CHMB 27G (MISCELLANEOUS) ×2 IMPLANT
CANNULA ANT/CHMB 27GA (MISCELLANEOUS) ×6 IMPLANT
DISSECTOR HYDRO NUCLEUS 50X22 (MISCELLANEOUS) ×3 IMPLANT
GLOVE SURG LX 7.5 STRW (GLOVE) ×2
GLOVE SURG LX STRL 7.5 STRW (GLOVE) ×1 IMPLANT
GLOVE SURG SYN 8.5  E (GLOVE) ×2
GLOVE SURG SYN 8.5 E (GLOVE) ×1 IMPLANT
GLOVE SURG SYN 8.5 PF PI (GLOVE) ×1 IMPLANT
GOWN STRL REUS W/ TWL LRG LVL3 (GOWN DISPOSABLE) ×2 IMPLANT
GOWN STRL REUS W/TWL LRG LVL3 (GOWN DISPOSABLE) ×6
LENS IOL TECNIS EYHANCE 22.5 ×2 IMPLANT
MARKER SKIN DUAL TIP RULER LAB (MISCELLANEOUS) ×3 IMPLANT
PACK DR. KING ARMS (PACKS) ×3 IMPLANT
PACK EYE AFTER SURG (MISCELLANEOUS) ×3 IMPLANT
PACK OPTHALMIC (MISCELLANEOUS) ×3 IMPLANT
SYR 3ML LL SCALE MARK (SYRINGE) ×3 IMPLANT
SYR TB 1ML LUER SLIP (SYRINGE) ×3 IMPLANT
WATER STERILE IRR 250ML POUR (IV SOLUTION) ×3 IMPLANT
WIPE NON LINTING 3.25X3.25 (MISCELLANEOUS) ×3 IMPLANT

## 2019-10-18 NOTE — H&P (Signed)
OPERATIVE NOTE  Kristin Mata [id number] 10/18/2019   PREOPERATIVE DIAGNOSIS:  Nuclear sclerotic cataract right eye.  H25.11   POSTOPERATIVE DIAGNOSIS:    Nuclear sclerotic cataract right eye.     PROCEDURE:  Phacoemusification with posterior chamber intraocular lens placement of the right eye   LENS:   Implant Name Type Inv. Item Serial No. Manufacturer Lot No. LRB No. Used Action  LENS II EYHANCE 22.5 - [id number]  LENS II EYHANCE 22.5 [id number] JOHNSON   Right 1 Implanted       Procedure(s): CATARACT EXTRACTION PHACO AND INTRAOCULAR LENS PLACEMENT (IOC) RIGHT 2.35  00:26.5 (Right)  DIB00 +22.5   ULTRASOUND TIME: 0 minutes 26.5 seconds.  CDE 2.35   SURGEON:  Benay Pillow, MD, MPH  ANESTHESIOLOGIST: Anesthesiologist: Alisa Graff, MD CRNA: Cameron Ali, CRNA   ANESTHESIA:  Topical with tetracaine drops augmented with 1% preservative-free intracameral lidocaine.  ESTIMATED BLOOD LOSS: less than 1 mL.   COMPLICATIONS:  None.   DESCRIPTION OF PROCEDURE:  The patient was identified in the holding room and transported to the operating room and placed in the supine position under the operating microscope.  The right eye was identified as the operative eye and it was prepped and draped in the usual sterile ophthalmic fashion.   A 1.0 millimeter clear-corneal paracentesis was made at the 10:30 position. 0.5 ml of preservative-free 1% lidocaine with epinephrine was injected into the anterior chamber.  The anterior chamber was filled with Healon 5 viscoelastic.  A 2.4 millimeter keratome was used to make a near-clear corneal incision at the 8:00 position.  A curvilinear capsulorrhexis was made with a cystotome and capsulorrhexis forceps.  Balanced salt solution was used to hydrodissect and hydrodelineate the nucleus.   Phacoemulsification was then used in stop and chop fashion to remove the lens nucleus and epinucleus.  The remaining cortex was then removed using the  irrigation and aspiration handpiece. Healon was then placed into the capsular bag to distend it for lens placement.  A lens was then injected into the capsular bag.  The remaining viscoelastic was aspirated.   Wounds were hydrated with balanced salt solution.  The anterior chamber was inflated to a physiologic pressure with balanced salt solution.   Intracameral vigamox 0.1 mL undiluted was injected into the eye and a drop placed onto the ocular surface.  No wound leaks were noted.  The patient was taken to the recovery room in stable condition without complications of anesthesia or surgery  Benay Pillow 10/18/2019, 1:02 PM

## 2019-10-18 NOTE — Anesthesia Preprocedure Evaluation (Signed)
Anesthesia Evaluation  Patient identified by MRN, date of birth, ID band Patient awake    Reviewed: Allergy & Precautions, H&P , NPO status , Patient's Chart, lab work & pertinent test results, reviewed documented beta blocker date and time   Airway Mallampati: II  TM Distance: >3 FB Neck ROM: full    Dental no notable dental hx.    Pulmonary asthma ,    Pulmonary exam normal breath sounds clear to auscultation       Cardiovascular Exercise Tolerance: Good hypertension,  Rhythm:regular Rate:Normal     Neuro/Psych Restless leg syndrome negative psych ROS   GI/Hepatic negative GI ROS, Neg liver ROS,   Endo/Other  negative endocrine ROS  Renal/GU negative Renal ROS  negative genitourinary   Musculoskeletal   Abdominal   Peds  Hematology negative hematology ROS (+)   Anesthesia Other Findings   Reproductive/Obstetrics negative OB ROS                             Anesthesia Physical Anesthesia Plan  ASA: II  Anesthesia Plan: MAC   Post-op Pain Management:    Induction:   PONV Risk Score and Plan: 2 and Treatment may vary due to age or medical condition  Airway Management Planned:   Additional Equipment:   Intra-op Plan:   Post-operative Plan:   Informed Consent: I have reviewed the patients History and Physical, chart, labs and discussed the procedure including the risks, benefits and alternatives for the proposed anesthesia with the patient or authorized representative who has indicated his/her understanding and acceptance.     Dental Advisory Given  Plan Discussed with: CRNA  Anesthesia Plan Comments:         Anesthesia Quick Evaluation

## 2019-10-18 NOTE — Transfer of Care (Signed)
Immediate Anesthesia Transfer of Care Note  Patient: Kristin Mata  Procedure(s) Performed: CATARACT EXTRACTION PHACO AND INTRAOCULAR LENS PLACEMENT (IOC) RIGHT 2.35  00:26.5 (Right Eye)  Patient Location: PACU  Anesthesia Type: MAC  Level of Consciousness: awake, alert  and patient cooperative  Airway and Oxygen Therapy: Patient Spontanous Breathing and Patient connected to supplemental oxygen  Post-op Assessment: Post-op Vital signs reviewed, Patient's Cardiovascular Status Stable, Respiratory Function Stable, Patent Airway and No signs of Nausea or vomiting  Post-op Vital Signs: Reviewed and stable  Complications: No complications documented.

## 2019-10-18 NOTE — Anesthesia Postprocedure Evaluation (Signed)
Anesthesia Post Note  Patient: Kristin Mata  Procedure(s) Performed: CATARACT EXTRACTION PHACO AND INTRAOCULAR LENS PLACEMENT (IOC) RIGHT 2.35  00:26.5 (Right Eye)     Patient location during evaluation: PACU Anesthesia Type: MAC Level of consciousness: awake and alert Pain management: pain level controlled Vital Signs Assessment: post-procedure vital signs reviewed and stable Respiratory status: spontaneous breathing, nonlabored ventilation, respiratory function stable and patient connected to nasal cannula oxygen Cardiovascular status: stable and blood pressure returned to baseline Postop Assessment: no apparent nausea or vomiting Anesthetic complications: no   No complications documented.  Alisa Graff

## 2019-10-18 NOTE — H&P (Signed)

## 2019-10-18 NOTE — Op Note (Signed)
OPERATIVE NOTE NOTE:  THIS SURGERY NOTE WAS ALSO POSTED UNDER THE H&P CATEGORY, SO WAS REFILED AS AN OP NOTE.     Alaine Loughney [id number] 10/18/2019   PREOPERATIVE DIAGNOSIS:  Nuclear sclerotic cataract right eye.  H25.11   POSTOPERATIVE DIAGNOSIS:    Nuclear sclerotic cataract right eye.     PROCEDURE:  Phacoemusification with posterior chamber intraocular lens placement of the right eye   LENS:   Implant Name Type Inv. Item Serial No. Manufacturer Lot No. LRB No. Used Action  LENS II EYHANCE 22.5 - [id number]  LENS II EYHANCE 22.5 [id number] JOHNSON   Right 1 Implanted       Procedure(s): CATARACT EXTRACTION PHACO AND INTRAOCULAR LENS PLACEMENT (IOC) RIGHT 2.35  00:26.5 (Right)  DIB00 +22.5   ULTRASOUND TIME: 0 minutes 26.5 seconds.  CDE 2.35   SURGEON:  Benay Pillow, MD, MPH  ANESTHESIOLOGIST: Anesthesiologist: Alisa Graff, MD CRNA: Cameron Ali, CRNA   ANESTHESIA:  Topical with tetracaine drops augmented with 1% preservative-free intracameral lidocaine.  ESTIMATED BLOOD LOSS: less than 1 mL.   COMPLICATIONS:  None.   DESCRIPTION OF PROCEDURE:  The patient was identified in the holding room and transported to the operating room and placed in the supine position under the operating microscope.  The right eye was identified as the operative eye and it was prepped and draped in the usual sterile ophthalmic fashion.   A 1.0 millimeter clear-corneal paracentesis was made at the 10:30 position. 0.5 ml of preservative-free 1% lidocaine with epinephrine was injected into the anterior chamber.  The anterior chamber was filled with Healon 5 viscoelastic.  A 2.4 millimeter keratome was used to make a near-clear corneal incision at the 8:00 position.  A curvilinear capsulorrhexis was made with a cystotome and capsulorrhexis forceps.  Balanced salt solution was used to hydrodissect and hydrodelineate the nucleus.   Phacoemulsification was then used in stop and chop fashion  to remove the lens nucleus and epinucleus.  The remaining cortex was then removed using the irrigation and aspiration handpiece. Healon was then placed into the capsular bag to distend it for lens placement.  A lens was then injected into the capsular bag.  The remaining viscoelastic was aspirated.   Wounds were hydrated with balanced salt solution.  The anterior chamber was inflated to a physiologic pressure with balanced salt solution.   Intracameral vigamox 0.1 mL undiluted was injected into the eye and a drop placed onto the ocular surface.  No wound leaks were noted.  The patient was taken to the recovery room in stable condition without complications of anesthesia or surgery  Benay Pillow 10/18/2019, 1:05 PM

## 2019-10-18 NOTE — Anesthesia Procedure Notes (Signed)
Procedure Name: MAC Date/Time: 10/18/2019 12:43 PM Performed by: Cameron Ali, CRNA Pre-anesthesia Checklist: Patient identified, Emergency Drugs available, Suction available, Timeout performed and Patient being monitored Patient Re-evaluated:Patient Re-evaluated prior to induction Oxygen Delivery Method: Nasal cannula Placement Confirmation: positive ETCO2

## 2019-10-19 ENCOUNTER — Encounter: Payer: Self-pay | Admitting: Ophthalmology

## 2019-10-23 ENCOUNTER — Other Ambulatory Visit: Payer: Self-pay | Admitting: Family Medicine

## 2019-10-23 DIAGNOSIS — G2581 Restless legs syndrome: Secondary | ICD-10-CM

## 2019-10-23 NOTE — Telephone Encounter (Signed)
Requested Prescriptions  Pending Prescriptions Disp Refills  . pramipexole (MIRAPEX) 0.125 MG tablet [Pharmacy Med Name: PRAMIPEXOLE 0.125 MG TABLET] 90 tablet 1    Sig: TAKE 1 TABLET (0.125 MG TOTAL) BY MOUTH AT BEDTIME.     Neurology:  Parkinsonian Agents Failed - 10/23/2019 11:32 AM      Failed - Last BP in normal range    BP Readings from Last 1 Encounters:  10/18/19 (!) 147/74         Passed - Valid encounter within last 12 months    Recent Outpatient Visits          3 weeks ago Restless leg syndrome   [facility] Juline Patch, MD   6 months ago Essential hypertension   [facility] Juline Patch, MD   1 year ago Essential hypertension   [facility] Juline Patch, MD   1 year ago H/O: HTN (hypertension)   [facility] Juline Patch, MD   2 years ago H/O: HTN (hypertension)   [facility] Juline Patch, MD      Future Appointments            In 5 months Juline Patch, MD [facility], [facility]

## 2019-10-26 ENCOUNTER — Other Ambulatory Visit: Payer: Self-pay | Admitting: Family Medicine

## 2019-10-26 DIAGNOSIS — I10 Essential (primary) hypertension: Secondary | ICD-10-CM

## 2019-10-26 NOTE — Telephone Encounter (Signed)
Requested Prescriptions  Pending Prescriptions Disp Refills  . losartan-hydrochlorothiazide (HYZAAR) 100-25 MG tablet [Pharmacy Med Name: LOSARTAN-HCTZ 100-25 MG TAB] 90 tablet 0    Sig: TAKE 1 TABLET BY MOUTH EVERY DAY     Cardiovascular: ARB + Diuretic Combos Failed - 10/26/2019  2:31 PM      Failed - Last BP in normal range    BP Readings from Last 1 Encounters:  10/18/19 (!) 147/74         Passed - K in normal range and within 180 days    Potassium  Date Value Ref Range Status  09/30/2019 4.4 3.5 - 5.2 mmol/L Final  03/11/2013 3.6 3.5 - 5.1 mmol/L Final         Passed - Na in normal range and within 180 days    Sodium  Date Value Ref Range Status  09/30/2019 140 134 - 144 mmol/L Final  03/11/2013 134 (L) 136 - 145 mmol/L Final         Passed - Cr in normal range and within 180 days    Creatinine  Date Value Ref Range Status  03/11/2013 0.68 0.60 - 1.30 mg/dL Final   Creatinine, Ser  Date Value Ref Range Status  09/30/2019 0.57 0.57 - 1.00 mg/dL Final         Passed - Ca in normal range and within 180 days    Calcium  Date Value Ref Range Status  09/30/2019 9.4 8.7 - 10.3 mg/dL Final   Calcium, Total  Date Value Ref Range Status  03/11/2013 8.2 (L) 8.5 - 10.1 mg/dL Final         Passed - Patient is not pregnant      Passed - Valid encounter within last 6 months    Recent Outpatient Visits          3 weeks ago Restless leg syndrome   [facility] Juline Patch, MD   7 months ago Essential hypertension   [facility] Juline Patch, MD   1 year ago Essential hypertension   [facility] Juline Patch, MD   1 year ago H/O: HTN (hypertension)   [facility] Juline Patch, MD   2 years ago H/O: HTN (hypertension)   Kendrick, Deanna C, MD      Future Appointments            In 5 months Juline Patch, MD [facility], [place]

## 2019-11-04 ENCOUNTER — Encounter: Payer: Self-pay | Admitting: Ophthalmology

## 2019-11-10 NOTE — Anesthesia Preprocedure Evaluation (Addendum)
Anesthesia Evaluation  Patient identified by MRN, date of birth, ID band Patient awake    Reviewed: Allergy & Precautions, NPO status , Patient's Chart, lab work & pertinent test results, reviewed documented beta blocker date and time   History of Anesthesia Complications Negative for: history of anesthetic complications  Airway Mallampati: III  TM Distance: >3 FB Neck ROM: Full    Dental   Pulmonary asthma , COPD,    breath sounds clear to auscultation       Cardiovascular hypertension, (-) angina(-) DOE  Rhythm:Regular Rate:Normal   HLD   Neuro/Psych  Restless leg syndrome H/o Guillan-Barre, 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)    GI/Hepatic neg GERD  ,  Endo/Other    Renal/GU      Musculoskeletal  (+) Arthritis ,   Abdominal   Peds  Hematology   Anesthesia Other Findings Colon cancer  Reproductive/Obstetrics                            Anesthesia Physical Anesthesia Plan  ASA: III  Anesthesia Plan: MAC   Post-op Pain Management:    Induction: Intravenous  PONV Risk Score and Plan: 2 and TIVA, Midazolam and Treatment may vary due to age or medical condition  Airway Management Planned: Nasal Cannula  Additional Equipment:   Intra-op Plan:   Post-operative Plan:   Informed Consent: I have reviewed the patients History and Physical, chart, labs and discussed the procedure including the risks, benefits and alternatives for the proposed anesthesia with the patient or authorized representative who has indicated his/her understanding and acceptance.       Plan Discussed with: CRNA and Anesthesiologist  Anesthesia Plan Comments:         Anesthesia Quick Evaluation

## 2019-11-11 ENCOUNTER — Other Ambulatory Visit
Admission: RE | Admit: 2019-11-11 | Discharge: 2019-11-11 | Disposition: A | Discharge status: Home / Self Care | Payer: Medicare PPO | Source: Ambulatory Visit | Attending: Ophthalmology | Admitting: Ophthalmology

## 2019-11-11 ENCOUNTER — Other Ambulatory Visit: Payer: Self-pay

## 2019-11-11 DIAGNOSIS — Z20822 Contact with and (suspected) exposure to covid-19: Secondary | ICD-10-CM | POA: Diagnosis not present

## 2019-11-11 DIAGNOSIS — Z01812 Encounter for preprocedural laboratory examination: Secondary | ICD-10-CM | POA: Insufficient documentation

## 2019-11-11 LAB — SARS CORONAVIRUS 2 (TAT 6-24 HRS): SARS Coronavirus 2: NEGATIVE

## 2019-11-11 NOTE — Discharge Instructions (Signed)

## 2019-11-15 ENCOUNTER — Encounter
Admission: RE | Disposition: A | Discharge status: Home / Self Care | Payer: Self-pay | Source: Home / Self Care | Attending: Ophthalmology

## 2019-11-15 ENCOUNTER — Encounter: Payer: Self-pay | Admitting: Ophthalmology

## 2019-11-15 ENCOUNTER — Ambulatory Visit: Payer: Medicare PPO | Admitting: Anesthesiology

## 2019-11-15 ENCOUNTER — Other Ambulatory Visit: Payer: Self-pay

## 2019-11-15 ENCOUNTER — Ambulatory Visit
Admission: RE | Admit: 2019-11-15 | Discharge: 2019-11-15 | Disposition: A | Discharge status: Home / Self Care | Payer: Medicare PPO | Attending: Ophthalmology | Admitting: Ophthalmology

## 2019-11-15 DIAGNOSIS — G620 Drug-induced polyneuropathy: Secondary | ICD-10-CM | POA: Diagnosis not present

## 2019-11-15 DIAGNOSIS — T451X5A Adverse effect of antineoplastic and immunosuppressive drugs, initial encounter: Secondary | ICD-10-CM | POA: Diagnosis not present

## 2019-11-15 DIAGNOSIS — E7800 Pure hypercholesterolemia, unspecified: Secondary | ICD-10-CM | POA: Insufficient documentation

## 2019-11-15 DIAGNOSIS — Z9841 Cataract extraction status, right eye: Secondary | ICD-10-CM | POA: Diagnosis not present

## 2019-11-15 DIAGNOSIS — Z791 Long term (current) use of non-steroidal anti-inflammatories (NSAID): Secondary | ICD-10-CM | POA: Diagnosis not present

## 2019-11-15 DIAGNOSIS — Z7982 Long term (current) use of aspirin: Secondary | ICD-10-CM | POA: Insufficient documentation

## 2019-11-15 DIAGNOSIS — G2581 Restless legs syndrome: Secondary | ICD-10-CM | POA: Insufficient documentation

## 2019-11-15 DIAGNOSIS — H2512 Age-related nuclear cataract, left eye: Secondary | ICD-10-CM | POA: Insufficient documentation

## 2019-11-15 DIAGNOSIS — Z79899 Other long term (current) drug therapy: Secondary | ICD-10-CM | POA: Diagnosis not present

## 2019-11-15 DIAGNOSIS — E785 Hyperlipidemia, unspecified: Secondary | ICD-10-CM | POA: Diagnosis not present

## 2019-11-15 DIAGNOSIS — I10 Essential (primary) hypertension: Secondary | ICD-10-CM | POA: Insufficient documentation

## 2019-11-15 DIAGNOSIS — Z85038 Personal history of other malignant neoplasm of large intestine: Secondary | ICD-10-CM | POA: Insufficient documentation

## 2019-11-15 DIAGNOSIS — J449 Chronic obstructive pulmonary disease, unspecified: Secondary | ICD-10-CM | POA: Insufficient documentation

## 2019-11-15 DIAGNOSIS — Z7951 Long term (current) use of inhaled steroids: Secondary | ICD-10-CM | POA: Diagnosis not present

## 2019-11-15 HISTORY — PX: CATARACT EXTRACTION W/PHACO: SHX586

## 2019-11-15 SURGERY — PHACOEMULSIFICATION, CATARACT, WITH IOL INSERTION
Anesthesia: Monitor Anesthesia Care | Site: Eye | Laterality: Left

## 2019-11-15 MED ORDER — ACETAMINOPHEN 10 MG/ML IV SOLN: 1000.0000 mg | Freq: Once | INTRAVENOUS | Status: DC | PRN

## 2019-11-15 MED ORDER — TETRACAINE HCL 0.5 % OP SOLN
1.0000 [drp] | OPHTHALMIC | Status: DC | PRN
  Administered 2019-11-15 (×3): 1 [drp] via OPHTHALMIC

## 2019-11-15 MED ORDER — ONDANSETRON HCL 4 MG/2ML IJ SOLN: 4.0000 mg | Freq: Once | INTRAMUSCULAR | Status: DC | PRN

## 2019-11-15 MED ORDER — EPINEPHRINE PF 1 MG/ML IJ SOLN
INTRAOCULAR | Status: DC | PRN
  Administered 2019-11-15: 69 mL via OPHTHALMIC

## 2019-11-15 MED ORDER — LACTATED RINGERS IV SOLN: INTRAVENOUS | Status: DC

## 2019-11-15 MED ORDER — FENTANYL CITRATE (PF) 100 MCG/2ML IJ SOLN
INTRAMUSCULAR | Status: DC | PRN
Start: 2019-11-15 — End: 2019-11-15
  Administered 2019-11-15 (×2): 50 ug via INTRAVENOUS

## 2019-11-15 MED ORDER — ARMC OPHTHALMIC DILATING DROPS
1.0000 "application " | OPHTHALMIC | Status: DC | PRN
  Administered 2019-11-15 (×3): 1 via OPHTHALMIC

## 2019-11-15 MED ORDER — MIDAZOLAM HCL 2 MG/2ML IJ SOLN
INTRAMUSCULAR | Status: DC | PRN
  Administered 2019-11-15: 2 mg via INTRAVENOUS

## 2019-11-15 MED ORDER — SODIUM HYALURONATE 10 MG/ML IO SOLN
INTRAOCULAR | Status: DC | PRN
  Administered 2019-11-15: 0.55 mL via INTRAOCULAR

## 2019-11-15 MED ORDER — MOXIFLOXACIN HCL 0.5 % OP SOLN
OPHTHALMIC | Status: DC | PRN
  Administered 2019-11-15: 0.2 mL via OPHTHALMIC

## 2019-11-15 MED ORDER — LIDOCAINE HCL (PF) 2 % IJ SOLN
INTRAOCULAR | Status: DC | PRN
  Administered 2019-11-15: 1 mL via INTRAOCULAR

## 2019-11-15 MED ORDER — SODIUM HYALURONATE 23 MG/ML IO SOLN
INTRAOCULAR | Status: DC | PRN
  Administered 2019-11-15: 0.6 mL via INTRAOCULAR

## 2019-11-15 SURGICAL SUPPLY — 19 items
CANNULA ANT/CHMB 27G (MISCELLANEOUS) ×2 IMPLANT
CANNULA ANT/CHMB 27GA (MISCELLANEOUS) ×6 IMPLANT
DISSECTOR HYDRO NUCLEUS 50X22 (MISCELLANEOUS) ×3 IMPLANT
GLOVE SURG LX 7.5 STRW (GLOVE) ×4
GLOVE SURG LX STRL 7.5 STRW (GLOVE) ×1 IMPLANT
GLOVE SURG SYN 8.5  E (GLOVE) ×2
GLOVE SURG SYN 8.5 E (GLOVE) ×1 IMPLANT
GLOVE SURG SYN 8.5 PF PI (GLOVE) ×1 IMPLANT
GOWN STRL REUS W/ TWL LRG LVL3 (GOWN DISPOSABLE) ×2 IMPLANT
GOWN STRL REUS W/TWL LRG LVL3 (GOWN DISPOSABLE) ×6
LENS IOL TECNIS EYHANCE 22.5 (Intraocular Lens) ×2 IMPLANT
MARKER SKIN DUAL TIP RULER LAB (MISCELLANEOUS) ×3 IMPLANT
PACK DR. KING ARMS (PACKS) ×3 IMPLANT
PACK EYE AFTER SURG (MISCELLANEOUS) ×3 IMPLANT
PACK OPTHALMIC (MISCELLANEOUS) ×3 IMPLANT
SYR 3ML LL SCALE MARK (SYRINGE) ×3 IMPLANT
SYR TB 1ML LUER SLIP (SYRINGE) ×3 IMPLANT
WATER STERILE IRR 250ML POUR (IV SOLUTION) ×3 IMPLANT
WIPE NON LINTING 3.25X3.25 (MISCELLANEOUS) ×3 IMPLANT

## 2019-11-15 NOTE — Anesthesia Postprocedure Evaluation (Signed)
Anesthesia Post Note  Patient: Kristin Mata  Procedure(s) Performed: CATARACT EXTRACTION PHACO AND INTRAOCULAR LENS PLACEMENT (IOC) LEFT (Left Eye)     Patient location during evaluation: PACU Anesthesia Type: MAC Level of consciousness: awake and alert Pain management: pain level controlled Vital Signs Assessment: post-procedure vital signs reviewed and stable Respiratory status: spontaneous breathing, nonlabored ventilation, respiratory function stable and patient connected to nasal cannula oxygen Cardiovascular status: stable and blood pressure returned to baseline Postop Assessment: no apparent nausea or vomiting Anesthetic complications: no   No complications documented.  Sherrine Salberg A  Maely Clements

## 2019-11-15 NOTE — Op Note (Signed)
OPERATIVE NOTE  Kristin Mata [id number] 11/15/2019   PREOPERATIVE DIAGNOSIS:  Nuclear sclerotic cataract left eye.  H25.12   POSTOPERATIVE DIAGNOSIS:    Nuclear sclerotic cataract left eye.     PROCEDURE:  Phacoemusification with posterior chamber intraocular lens placement of the left eye   LENS:   Implant Name Type Inv. Item Serial No. Manufacturer Lot No. LRB No. Used Action  LENS IOL TECNIS EYHANCE 22.5 - [id number] Intraocular Lens LENS IOL TECNIS EYHANCE 22.5 [id number] [id number]   Left 1 Implanted      Procedure(s) with comments: CATARACT EXTRACTION PHACO AND INTRAOCULAR LENS PLACEMENT (IOC) LEFT (Left) - 1.50 0:19.1  DIB00 +22.5   ULTRASOUND TIME: 0 minutes 19 seconds.  CDE 1.50   SURGEON:  Benay Pillow, MD, MPH   ANESTHESIA:  Topical with tetracaine drops augmented with 1% preservative-free intracameral lidocaine.  ESTIMATED BLOOD LOSS: <1 mL   COMPLICATIONS:  None.   DESCRIPTION OF PROCEDURE:  The patient was identified in the holding room and transported to the operating room and placed in the supine position under the operating microscope.  The left eye was identified as the operative eye and it was prepped and draped in the usual sterile ophthalmic fashion.   A 1.0 millimeter clear-corneal paracentesis was made at the 5:00 position. 0.5 ml of preservative-free 1% lidocaine with epinephrine was injected into the anterior chamber.  The anterior chamber was filled with Healon 5 viscoelastic.  A 2.4 millimeter keratome was used to make a near-clear corneal incision at the 2:00 position.  A curvilinear capsulorrhexis was made with a cystotome and capsulorrhexis forceps.  Balanced salt solution was used to hydrodissect and hydrodelineate the nucleus.   Phacoemulsification was then used in stop and chop fashion to remove the lens nucleus and epinucleus.  The remaining cortex was then removed using the irrigation and aspiration handpiece. Healon was then placed into the  capsular bag to distend it for lens placement.  A lens was then injected into the capsular bag.  The remaining viscoelastic was aspirated.   Wounds were hydrated with balanced salt solution.  The anterior chamber was inflated to a physiologic pressure with balanced salt solution.  There was moderate emulsate (tiny residual lens particles in the anterior vitreous behind an intact posterior capsule).  Intracameral vigamox 0.1 mL undiltued was injected into the eye and a drop placed onto the ocular surface.  No wound leaks were noted.  The patient was taken to the recovery room in stable condition without complications of anesthesia or surgery  Benay Pillow 11/15/2019, 9:12 AM

## 2019-11-15 NOTE — Transfer of Care (Signed)
Immediate Anesthesia Transfer of Care Note  Patient: Kristin Mata  Procedure(s) Performed: CATARACT EXTRACTION PHACO AND INTRAOCULAR LENS PLACEMENT (IOC) LEFT (Left Eye)  Patient Location: PACU  Anesthesia Type: MAC  Level of Consciousness: awake, alert  and patient cooperative  Airway and Oxygen Therapy: Patient Spontanous Breathing and Patient connected to supplemental oxygen  Post-op Assessment: Post-op Vital signs reviewed, Patient's Cardiovascular Status Stable, Respiratory Function Stable, Patent Airway and No signs of Nausea or vomiting  Post-op Vital Signs: Reviewed and stable  Complications: No complications documented.

## 2019-11-15 NOTE — Anesthesia Procedure Notes (Signed)
Procedure Name: MAC Date/Time: 11/15/2019 8:53 AM Performed by: Silvana Newness, CRNA Pre-anesthesia Checklist: Patient identified, Emergency Drugs available, Suction available, Patient being monitored and Timeout performed Patient Re-evaluated:Patient Re-evaluated prior to induction Oxygen Delivery Method: Nasal cannula Placement Confirmation: positive ETCO2

## 2019-11-15 NOTE — H&P (Signed)

## 2019-11-16 ENCOUNTER — Encounter: Payer: Self-pay | Admitting: Ophthalmology

## 2019-11-23 ENCOUNTER — Other Ambulatory Visit: Payer: Self-pay | Admitting: Obstetrics and Gynecology

## 2019-11-23 DIAGNOSIS — Z1231 Encounter for screening mammogram for malignant neoplasm of breast: Secondary | ICD-10-CM

## 2019-12-13 ENCOUNTER — Telehealth: Payer: Self-pay

## 2019-12-13 NOTE — Telephone Encounter (Unsigned)
Copied from [place] [id number]. Topic: General - Inquiry >> Dec 13, 2019  9:40 AM Greggory Keen D wrote: Reason for CRM: Pt has questions about her taking Omeprazole.   CB# [phone]  [facility] [place]

## 2019-12-13 NOTE — Telephone Encounter (Signed)
Copied from [place] [phone]. Topic: General - Inquiry >> Dec 13, 2019  9:40 AM Greggory Keen D wrote: Reason for CRM: Pt has questions about her taking Omeprazole.   CB[facility] [phone]  [facility] [place]

## 2019-12-13 NOTE — Telephone Encounter (Signed)
Called and left message/ I don't see where we have filled this?

## 2019-12-17 ENCOUNTER — Other Ambulatory Visit: Payer: Self-pay

## 2019-12-17 DIAGNOSIS — K219 Gastro-esophageal reflux disease without esophagitis: Secondary | ICD-10-CM

## 2019-12-17 MED ORDER — OMEPRAZOLE 10 MG PO CPDR: 10.0000 mg | DELAYED_RELEASE_CAPSULE | Freq: Every day | ORAL | 1 refills | Status: DC

## 2019-12-17 NOTE — Telephone Encounter (Signed)
Sent in omeprazole to [facility] [place]

## 2019-12-17 NOTE — Telephone Encounter (Signed)
Pt said she was told since she takes meloxicam to also take omeprazole. Pt was buying medication OTC and would like to [facility] [place] [address]. Pt will having her phone nearby pt would like  tara to return her call

## 2019-12-21 ENCOUNTER — Ambulatory Visit
Admission: RE | Admit: 2019-12-21 | Discharge: 2019-12-21 | Disposition: A | Discharge status: Home / Self Care | Payer: Medicare PPO | Source: Ambulatory Visit | Attending: Obstetrics and Gynecology | Admitting: Obstetrics and Gynecology

## 2019-12-21 ENCOUNTER — Other Ambulatory Visit: Payer: Self-pay

## 2019-12-21 DIAGNOSIS — Z1231 Encounter for screening mammogram for malignant neoplasm of breast: Secondary | ICD-10-CM | POA: Diagnosis not present

## 2019-12-29 DIAGNOSIS — R202 Paresthesia of skin: Secondary | ICD-10-CM | POA: Diagnosis not present

## 2019-12-29 DIAGNOSIS — R200 Anesthesia of skin: Secondary | ICD-10-CM | POA: Diagnosis not present

## 2020-01-14 ENCOUNTER — Other Ambulatory Visit: Payer: Self-pay

## 2020-01-14 ENCOUNTER — Telehealth: Payer: Self-pay

## 2020-01-14 DIAGNOSIS — J018 Other acute sinusitis: Secondary | ICD-10-CM

## 2020-01-14 MED ORDER — AZITHROMYCIN 250 MG PO TABS: ORAL_TABLET | ORAL | 0 refills | Status: DC

## 2020-01-14 NOTE — Progress Notes (Signed)
Sent in [place]- will see on Monday in follow up

## 2020-01-14 NOTE — Telephone Encounter (Unsigned)
Copied from [place] [id number]. Topic: General - Other >> Jan 14, 2020 11:03 AM Hinda Lenis D wrote: PT has sinus infection / need to speak with Baxter Flattery / please advise

## 2020-01-17 ENCOUNTER — Encounter: Payer: Self-pay | Admitting: Family Medicine

## 2020-01-17 ENCOUNTER — Other Ambulatory Visit: Payer: Self-pay

## 2020-01-17 ENCOUNTER — Ambulatory Visit (INDEPENDENT_AMBULATORY_CARE_PROVIDER_SITE_OTHER): Payer: Medicare PPO | Admitting: Family Medicine

## 2020-01-17 VITALS — BP 130/80 | HR 80 | Temp 98.6°F | Ht 60.0 in | Wt 145.0 lb

## 2020-01-17 DIAGNOSIS — J018 Other acute sinusitis: Secondary | ICD-10-CM

## 2020-01-17 DIAGNOSIS — R059 Cough, unspecified: Secondary | ICD-10-CM | POA: Diagnosis not present

## 2020-01-17 MED ORDER — GUAIFENESIN-CODEINE 100-10 MG/5ML PO SYRP: 5.0000 mL | ORAL_SOLUTION | Freq: Four times a day (QID) | ORAL | 0 refills | Status: DC | PRN

## 2020-01-17 NOTE — Progress Notes (Signed)
Date:  01/17/2020   Name:  Kristin Mata   DOB:  02/21/1950   MRN:  [id number]   Chief Complaint: Sinusitis (Follow up on starting [place] on Friday- )  Sinusitis This is a chronic problem. The current episode started more than 1 year ago. The problem has been gradually improving since onset. There has been no fever. The pain is mild. Associated symptoms include congestion, coughing, a hoarse voice and sinus pressure. Pertinent negatives include no chills, diaphoresis, ear pain, headaches, neck pain, shortness of breath, sneezing, sore throat or swollen glands. Past treatments include oral decongestants (fireball). The treatment provided mild relief.    Lab Results  Component Value Date   CREATININE 0.57 09/30/2019   BUN 14 09/30/2019   NA 140 09/30/2019   K 4.4 09/30/2019   CL 101 09/30/2019   CO2 25 09/30/2019   Lab Results  Component Value Date   CHOL 170 09/30/2019   HDL 59 09/30/2019   LDLCALC 99 09/30/2019   TRIG 63 09/30/2019   CHOLHDL 3.1 08/15/2017   No results found for: TSH No results found for: HGBA1C Lab Results  Component Value Date   WBC 8.4 03/11/2013   HGB 8.2 (L) 03/12/2013   HCT 21.5 (L) 03/11/2013   MCV 85 03/11/2013   PLT 395 03/11/2013   Lab Results  Component Value Date   ALT 25 09/30/2019   AST 21 09/30/2019   ALKPHOS 58 09/30/2019   BILITOT 0.2 09/30/2019     Review of Systems  Constitutional: Negative.  Negative for chills, diaphoresis, fatigue, fever and unexpected weight change.  HENT: Positive for congestion, hoarse voice and sinus pressure. Negative for ear discharge, ear pain, rhinorrhea, sneezing and sore throat.   Eyes: Negative for double vision, photophobia, pain, discharge, redness and itching.  Respiratory: Positive for cough. Negative for shortness of breath, wheezing and stridor.   Gastrointestinal: Negative for abdominal pain, blood in stool, constipation, diarrhea, nausea and vomiting.  Endocrine: Negative for cold  intolerance, heat intolerance, polydipsia, polyphagia and polyuria.  Genitourinary: Negative for dysuria, flank pain, frequency, hematuria, menstrual problem, pelvic pain, urgency, vaginal bleeding and vaginal discharge.  Musculoskeletal: Negative for arthralgias, back pain, myalgias and neck pain.  Skin: Negative for rash.  Allergic/Immunologic: Negative for environmental allergies and food allergies.  Neurological: Negative for dizziness, weakness, light-headedness, numbness and headaches.  Hematological: Negative for adenopathy. Does not bruise/bleed easily.  Psychiatric/Behavioral: Negative for dysphoric mood. The patient is not nervous/anxious.     Patient Active Problem List   Diagnosis Date Noted  . Essential hypertension 10/31/2016  . Moderate asthma without complication 40/98/1191  . 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 H/O: HTN (hypertension) 06/07/2014  . Asthma, mild intermittent 06/07/2014    No Known Allergies  Past Surgical History:  Procedure Laterality Date  . ABDOMINAL HYSTERECTOMY    . BREAST CYST ASPIRATION Bilateral    neg  . CATARACT EXTRACTION W/PHACO Right 10/18/2019   Procedure: CATARACT EXTRACTION PHACO AND INTRAOCULAR LENS PLACEMENT (IOC) RIGHT 2.35  00:26.5;  Surgeon: Eulogio Bear, MD;  Location: [facility];  Service: Ophthalmology;  Laterality: Right;  . CATARACT EXTRACTION W/PHACO Left 11/15/2019   Procedure: CATARACT EXTRACTION PHACO AND INTRAOCULAR LENS PLACEMENT ([place]) LEFT;  Surgeon: Eulogio Bear, MD;  Location: [facility];  Service: Ophthalmology;  Laterality: Left;  1.50 0:19.1  . COLON RESECTION    . HAMMER TOE SURGERY    . HAND SURGERY    .  HAND SURGERY     Thumb  . ROTATOR CUFF REPAIR Right     Social History   Tobacco Use  . Smoking status: Never Smoker  . Smokeless tobacco: Never Used  Vaping Use  . Vaping Use: Never used  Substance Use  Topics  . Alcohol use: Yes    Alcohol/week: 1.0 standard drink    Types: 1 Standard drinks or equivalent per week  . Drug use: No     Medication list has been reviewed and updated.  Current Meds  Medication Sig  . albuterol (PROVENTIL HFA;VENTOLIN HFA) 108 (90 Base) MCG/ACT inhaler [name]  . ascorbic acid (VITAMIN C) 250 MG tablet Take 1 tablet by mouth daily.  Marland Kitchen aspirin EC 81 MG tablet Take 81 mg by mouth daily.  Marland Kitchen azithromycin (ZITHROMAX) 250 MG tablet Take 2 tabs today and 1 tab each day after until completed  . Biotin 10 MG TABS Take 1 tablet by mouth daily.  Marland Kitchen CANNABIDIOL PO Take 3,000 mg by mouth daily.  . Cholecalciferol (VITAMIN D PO) Take by mouth.  Marland Kitchen CINNAMON PO Take 1 capsule by mouth daily.  . ferrous sulfate 325 (65 FE) MG tablet Take 325 mg by mouth daily with breakfast.  . fexofenadine (ALLEGRA) 180 MG tablet Take 1 tablet (180 mg total) by mouth daily.  . fluticasone (FLONASE) 50 MCG/ACT nasal spray One puff each nostril q day  . Fluticasone-Salmeterol (ADVAIR) 250-50 MCG/DOSE AEPB Inhale 1 puff into the lungs daily. Dr [name]  . gabapentin (NEURONTIN) 100 MG capsule TAKE 1 CAPSULE BY MOUTH THREE TIMES A DAY  . losartan-hydrochlorothiazide (HYZAAR) 100-25 MG tablet TAKE 1 TABLET BY MOUTH EVERY DAY  . meloxicam (MOBIC) 15 MG tablet TAKE 1 TABLET BY MOUTH EVERY DAY  . montelukast (SINGULAIR) 10 MG tablet TAKE 1 TABLET BY MOUTH EVERYDAY AT BEDTIME  . Multiple Vitamin (MULTIVITAMIN) tablet Take 1 tablet by mouth daily.  . Omega-3 Fatty Acids (FISH OIL) 1000 MG CAPS Take 1 capsule by mouth 2 (two) times daily.  Marland Kitchen omeprazole (PRILOSEC) 10 MG capsule Take 1 capsule (10 mg total) by mouth daily.  . pramipexole (MIRAPEX) 0.125 MG tablet TAKE 1 TABLET (0.125 MG TOTAL) BY MOUTH AT BEDTIME.  . simvastatin (ZOCOR) 20 MG tablet TAKE 1 TABLET BY MOUTH EVERY DAY  . TURMERIC CURCUMIN PO Take by mouth in the morning and at bedtime. Indrepta C  . vitamin E 1000 UNIT capsule Take 1  capsule by mouth daily.    PHQ 2/9 Scores 01/17/2020 09/29/2018 10/31/2016 07/18/2015  PHQ - 2 Score 0 0 0 0  PHQ- 9 Score 0 0 - -    No flowsheet data found.  BP Readings from Last 3 Encounters:  01/17/20 130/80  11/15/19 130/60  10/18/19 (!) 147/74    Physical Exam  Wt Readings from Last 3 Encounters:  01/17/20 145 lb (65.8 kg)  11/15/19 140 lb (63.5 kg)  10/18/19 142 lb (64.4 kg)    BP 130/80   Pulse 80   Temp 98.6 F (37 C) (Oral)   Ht 5' (1.524 m)   Wt 145 lb (65.8 kg)   BMI 28.32 kg/m   Assessment and Plan: Patient has had access to Covid testing and she tested negative. 1. Acute non-recurrent sinusitis of other sinus New onset.  Persistent.  Gradually improving since we started on azithromycin on Friday.  Exam is consistent with a upper respiratory infection likely sinusitis.  We will continue azithromycin at current dosing.  2. Cough Patient  has a persistent cough which is only partially treated by fireballs however we will probably use a mucolytic agent with this along with a little bit of codeine.  Patient's been encouraged to use her barbells for pleasure and not for cough. - guaiFENesin-codeine (ROBITUSSIN AC) 100-10 MG/5ML syrup; Take 5 mLs by mouth 4 (four) times daily as needed for cough.  Dispense: 118 mL; Refill: 0

## 2020-01-31 ENCOUNTER — Other Ambulatory Visit: Payer: Self-pay | Admitting: Family Medicine

## 2020-01-31 DIAGNOSIS — E7849 Other hyperlipidemia: Secondary | ICD-10-CM

## 2020-01-31 DIAGNOSIS — M1990 Unspecified osteoarthritis, unspecified site: Secondary | ICD-10-CM

## 2020-03-02 DIAGNOSIS — G608 Other hereditary and idiopathic neuropathies: Secondary | ICD-10-CM | POA: Diagnosis not present

## 2020-03-26 ENCOUNTER — Other Ambulatory Visit: Payer: Self-pay | Admitting: Family Medicine

## 2020-03-26 DIAGNOSIS — E7849 Other hyperlipidemia: Secondary | ICD-10-CM

## 2020-03-26 NOTE — Telephone Encounter (Signed)
Requested Prescriptions  Pending Prescriptions Disp Refills  . simvastatin (ZOCOR) 20 MG tablet [Pharmacy Med Name: SIMVASTATIN 20 MG TABLET] 90 tablet 1    Sig: TAKE 1 TABLET BY MOUTH EVERY DAY     Cardiovascular:  Antilipid - Statins Failed - 03/26/2020  9:02 AM      Failed - LDL in normal range and within 360 days    Ldl Cholesterol, Calc  Date Value Ref Range Status  03/11/2013 48 0 - 100 mg/dL Final   LDL Chol Calc (NIH)  Date Value Ref Range Status  09/30/2019 99 0 - 99 mg/dL Final         Passed - Total Cholesterol in normal range and within 360 days    Cholesterol, Total  Date Value Ref Range Status  09/30/2019 170 100 - 199 mg/dL Final   Cholesterol  Date Value Ref Range Status  03/11/2013 95 0 - 200 mg/dL Final         Passed - HDL in normal range and within 360 days    HDL Cholesterol  Date Value Ref Range Status  03/11/2013 29 (L) 40 - 60 mg/dL Final   HDL  Date Value Ref Range Status  09/30/2019 59 >39 mg/dL Final         Passed - Triglycerides in normal range and within 360 days    Triglycerides  Date Value Ref Range Status  09/30/2019 63 0 - 149 mg/dL Final  03/11/2013 91 0 - 200 mg/dL Final         Passed - Patient is not pregnant      Passed - Valid encounter within last 12 months    Recent Outpatient Visits          2 months ago Acute non-recurrent sinusitis of other sinus   [facility] Juline Patch, MD   5 months ago Restless leg syndrome   [facility] Juline Patch, MD   12 months ago Essential hypertension   [place], Deanna C, MD   1 year ago Essential hypertension   [facility] Juline Patch, MD   1 year ago H/O: HTN (hypertension)   [facility] Juline Patch, MD      Future Appointments            In 4 days Juline Patch, MD [facility], [facility]

## 2020-03-30 ENCOUNTER — Encounter: Payer: Self-pay | Admitting: Family Medicine

## 2020-03-30 ENCOUNTER — Other Ambulatory Visit: Payer: Self-pay

## 2020-03-30 ENCOUNTER — Ambulatory Visit: Payer: Medicare PPO | Admitting: Family Medicine

## 2020-03-30 VITALS — BP 130/64 | HR 68 | Ht 60.0 in | Wt 144.0 lb

## 2020-03-30 DIAGNOSIS — I10 Essential (primary) hypertension: Secondary | ICD-10-CM | POA: Diagnosis not present

## 2020-03-30 DIAGNOSIS — E7849 Other hyperlipidemia: Secondary | ICD-10-CM | POA: Diagnosis not present

## 2020-03-30 DIAGNOSIS — G2581 Restless legs syndrome: Secondary | ICD-10-CM

## 2020-03-30 DIAGNOSIS — J301 Allergic rhinitis due to pollen: Secondary | ICD-10-CM

## 2020-03-30 DIAGNOSIS — R69 Illness, unspecified: Secondary | ICD-10-CM

## 2020-03-30 DIAGNOSIS — K219 Gastro-esophageal reflux disease without esophagitis: Secondary | ICD-10-CM | POA: Diagnosis not present

## 2020-03-30 DIAGNOSIS — M1990 Unspecified osteoarthritis, unspecified site: Secondary | ICD-10-CM | POA: Diagnosis not present

## 2020-03-30 DIAGNOSIS — J4520 Mild intermittent asthma, uncomplicated: Secondary | ICD-10-CM

## 2020-03-30 MED ORDER — OMEPRAZOLE 10 MG PO CPDR: 10.0000 mg | DELAYED_RELEASE_CAPSULE | Freq: Every day | ORAL | 1 refills | Status: DC

## 2020-03-30 MED ORDER — SIMVASTATIN 20 MG PO TABS: 20.0000 mg | ORAL_TABLET | Freq: Every day | ORAL | 1 refills | Status: DC

## 2020-03-30 MED ORDER — PRAMIPEXOLE DIHYDROCHLORIDE 0.125 MG PO TABS
0.1250 mg | ORAL_TABLET | Freq: Every evening | ORAL | 1 refills | Status: DC
Start: 2020-03-30 — End: 2020-06-07

## 2020-03-30 MED ORDER — MELOXICAM 15 MG PO TABS: 15.0000 mg | ORAL_TABLET | Freq: Every day | ORAL | 1 refills | Status: DC

## 2020-03-30 MED ORDER — FLUTICASONE PROPIONATE 50 MCG/ACT NA SUSP: NASAL | 1 refills | Status: DC

## 2020-03-30 MED ORDER — FEXOFENADINE HCL 180 MG PO TABS: 180.0000 mg | ORAL_TABLET | Freq: Every day | ORAL | 1 refills | Status: DC

## 2020-03-30 MED ORDER — LOSARTAN POTASSIUM-HCTZ 100-25 MG PO TABS: 1.0000 | ORAL_TABLET | Freq: Every day | ORAL | 1 refills | Status: DC

## 2020-03-30 MED ORDER — MONTELUKAST SODIUM 10 MG PO TABS
ORAL_TABLET | ORAL | 1 refills | Status: DC
Start: 2020-03-30 — End: 2020-07-27

## 2020-03-30 NOTE — Progress Notes (Signed)
Date:  03/30/2020   Name:  Kristin Mata   DOB:  1950/07/09   MRN:  [id number]   Chief Complaint: Hyperlipidemia, Hypertension, Allergic Rhinitis , restless leg, Asthma, Gastroesophageal Reflux, and Arthritis  Hyperlipidemia This is a chronic problem. The current episode started more than 1 year ago. The problem is controlled. Recent lipid tests were reviewed and are normal. She has no history of chronic renal disease, diabetes, hypothyroidism, liver disease, obesity or nephrotic syndrome. There are no known factors aggravating her hyperlipidemia. Pertinent negatives include no chest pain, focal sensory loss, focal weakness, leg pain, myalgias or shortness of breath. Current antihyperlipidemic treatment includes statins. The current treatment provides moderate improvement of lipids. There are no compliance problems.  Risk factors for coronary artery disease include dyslipidemia.  Hypertension This is a chronic problem. The current episode started more than 1 year ago. The problem has been gradually improving since onset. The problem is controlled. Pertinent negatives include no anxiety, blurred vision, chest pain, headaches, malaise/fatigue, neck pain, orthopnea, palpitations, peripheral edema, PND, shortness of breath or sweats. Past treatments include angiotensin blockers and diuretics. The current treatment provides moderate improvement. There are no compliance problems.  There is no history of chronic renal disease.  Asthma There is no chest tightness, cough, difficulty breathing, frequent throat clearing, hemoptysis, hoarse voice, shortness of breath, sputum production or wheezing. This is a chronic problem. The problem occurs every several days. The problem has been waxing and waning. Pertinent negatives include no appetite change, chest pain, dyspnea on exertion, ear pain, fever, headaches, heartburn, malaise/fatigue, myalgias, PND, rhinorrhea, sneezing, sore throat or sweats. Her symptoms  are alleviated by beta-agonist, leukotriene antagonist and steroid inhaler. She reports moderate improvement on treatment. Her past medical history is significant for asthma.  Gastroesophageal Reflux She reports no abdominal pain, no belching, no chest pain, no choking, no coughing, no dysphagia, no early satiety, no globus sensation, no heartburn, no hoarse voice, no nausea, no sore throat, no stridor, no tooth decay, no water brash or no wheezing. This is a chronic problem. The problem occurs occasionally. The problem has been gradually improving. Pertinent negatives include no fatigue. She has tried a PPI for the symptoms. The treatment provided moderate relief.  Arthritis Presents for follow-up visit. She complains of stiffness. She reports no pain, joint swelling or joint warmth. The symptoms have been stable. Affected location: hands. Pertinent negatives include no diarrhea, dysuria, fatigue, fever or rash.  URI  This is a recurrent (for allergies) problem. The problem has been waxing and waning. Pertinent negatives include no abdominal pain, chest pain, congestion, coughing, diarrhea, dysuria, ear pain, headaches, joint swelling, nausea, neck pain, rash, rhinorrhea, sneezing, sore throat, vomiting or wheezing. She has tried antihistamine for the symptoms.    Lab Results  Component Value Date   CREATININE 0.57 09/30/2019   BUN 14 09/30/2019   NA 140 09/30/2019   K 4.4 09/30/2019   CL 101 09/30/2019   CO2 25 09/30/2019   Lab Results  Component Value Date   CHOL 170 09/30/2019   HDL 59 09/30/2019   LDLCALC 99 09/30/2019   TRIG 63 09/30/2019   CHOLHDL 3.1 08/15/2017   No results found for: TSH No results found for: HGBA1C Lab Results  Component Value Date   WBC 8.4 03/11/2013   HGB 8.2 (L) 03/12/2013   HCT 21.5 (L) 03/11/2013   MCV 85 03/11/2013   PLT 395 03/11/2013   Lab Results  Component Value Date  ALT 25 09/30/2019   AST 21 09/30/2019   ALKPHOS 58 09/30/2019    BILITOT 0.2 09/30/2019     Review of Systems  Constitutional: Negative.  Negative for appetite change, chills, fatigue, fever, malaise/fatigue and unexpected weight change.  HENT: Negative for congestion, ear discharge, ear pain, hoarse voice, rhinorrhea, sinus pressure, sneezing and sore throat.   Eyes: Negative for blurred vision, double vision, photophobia, pain, discharge, redness and itching.  Respiratory: Negative for cough, hemoptysis, sputum production, choking, shortness of breath, wheezing and stridor.   Cardiovascular: Negative for chest pain, dyspnea on exertion, palpitations, orthopnea and PND.  Gastrointestinal: Negative for abdominal pain, blood in stool, constipation, diarrhea, dysphagia, heartburn, nausea and vomiting.  Endocrine: Negative for cold intolerance, heat intolerance, polydipsia, polyphagia and polyuria.  Genitourinary: Negative for dysuria, flank pain, frequency, hematuria, menstrual problem, pelvic pain, urgency, vaginal bleeding and vaginal discharge.  Musculoskeletal: Positive for arthritis and stiffness. Negative for arthralgias, back pain, joint swelling, myalgias and neck pain.  Skin: Negative for rash.  Allergic/Immunologic: Negative for environmental allergies and food allergies.  Neurological: Negative for dizziness, focal weakness, weakness, light-headedness, numbness and headaches.  Hematological: Negative for adenopathy. Does not bruise/bleed easily.  Psychiatric/Behavioral: Negative for dysphoric mood. The patient is not nervous/anxious.     Patient Active Problem List   Diagnosis Date Noted  . Essential hypertension 10/31/2016  . Moderate asthma without complication 32/99/2426  . 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 H/O: HTN (hypertension) 06/07/2014  . Asthma, mild intermittent 06/07/2014    No Known Allergies  Past Surgical History:  Procedure Laterality Date  . ABDOMINAL  HYSTERECTOMY    . BREAST CYST ASPIRATION Bilateral    neg  . CATARACT EXTRACTION W/PHACO Right 10/18/2019   Procedure: CATARACT EXTRACTION PHACO AND INTRAOCULAR LENS PLACEMENT ([facility]) RIGHT 2.35  00:26.5;  Surgeon: Eulogio Bear, MD;  Location: [facility];  Service: Ophthalmology;  Laterality: Right;  . CATARACT EXTRACTION W/PHACO Left 11/15/2019   Procedure: CATARACT EXTRACTION PHACO AND INTRAOCULAR LENS PLACEMENT ([place]) LEFT;  Surgeon: Eulogio Bear, MD;  Location: [facility];  Service: Ophthalmology;  Laterality: Left;  1.50 0:19.1  . COLON RESECTION    . HAMMER TOE SURGERY    . HAND SURGERY    . HAND SURGERY     Thumb  . ROTATOR CUFF REPAIR Right     Social History   Tobacco Use  . Smoking status: Never Smoker  . Smokeless tobacco: Never Used  Vaping Use  . Vaping Use: Never used  Substance Use Topics  . Alcohol use: Yes    Alcohol/week: 1.0 standard drink    Types: 1 Standard drinks or equivalent per week  . Drug use: No     Medication list has been reviewed and updated.  Current Meds  Medication Sig  . albuterol (PROVENTIL HFA;VENTOLIN HFA) 108 (90 Base) MCG/ACT inhaler [name]  . ascorbic acid (VITAMIN C) 250 MG tablet Take 1 tablet by mouth daily.  Marland Kitchen aspirin EC 81 MG tablet Take 81 mg by mouth daily.  . Biotin 10 MG TABS Take 1 tablet by mouth daily.  Marland Kitchen CANNABIDIOL PO Take 3,000 mg by mouth daily.  . Cholecalciferol (VITAMIN D PO) Take by mouth.  . ferrous sulfate 325 (65 FE) MG tablet Take 325 mg by mouth daily with breakfast.  . fexofenadine (ALLEGRA) 180 MG tablet Take 1 tablet (180 mg total) by mouth daily.  . fluticasone (FLONASE) 50 MCG/ACT nasal spray  One puff each nostril q day  . Fluticasone-Salmeterol (ADVAIR) 250-50 MCG/DOSE AEPB Inhale 1 puff into the lungs daily. Dr [name]  . gabapentin (NEURONTIN) 100 MG capsule TAKE 1 CAPSULE BY MOUTH THREE TIMES A DAY (Patient taking differently: 1 in am and 3 in pmManuella Ghazi)  .  losartan-hydrochlorothiazide (HYZAAR) 100-25 MG tablet TAKE 1 TABLET BY MOUTH EVERY DAY  . meloxicam (MOBIC) 15 MG tablet TAKE 1 TABLET BY MOUTH EVERY DAY  . montelukast (SINGULAIR) 10 MG tablet TAKE 1 TABLET BY MOUTH EVERYDAY AT BEDTIME  . Multiple Vitamin (MULTIVITAMIN) tablet Take 1 tablet by mouth daily.  . Omega-3 Fatty Acids (FISH OIL) 1000 MG CAPS Take 1 capsule by mouth 2 (two) times daily.  Marland Kitchen omeprazole (PRILOSEC) 10 MG capsule Take 1 capsule (10 mg total) by mouth daily.  . pramipexole (MIRAPEX) 0.125 MG tablet TAKE 1 TABLET (0.125 MG TOTAL) BY MOUTH AT BEDTIME.  . simvastatin (ZOCOR) 20 MG tablet TAKE 1 TABLET BY MOUTH EVERY DAY  . TURMERIC CURCUMIN PO Take by mouth in the morning and at bedtime. Indrepta C  . vitamin E 1000 UNIT capsule Take 1 capsule by mouth daily.    PHQ 2/9 Scores 03/30/2020 01/17/2020 09/29/2018 10/31/2016  PHQ - 2 Score 0 0 0 0  PHQ- 9 Score 0 0 0 -    GAD 7 : Generalized Anxiety Score 03/30/2020  Nervous, Anxious, on Edge 0  Control/stop worrying 0  Worry too much - different things 0  Trouble relaxing 0  Restless 0  Easily annoyed or irritable 0  Afraid - awful might happen 0  Total GAD 7 Score 0    BP Readings from Last 3 Encounters:  03/30/20 130/64  01/17/20 130/80  11/15/19 130/60    Physical Exam Constitutional:      General: She is not in acute distress.    Appearance: She is not diaphoretic.  HENT:     Head: Normocephalic and atraumatic.     Right Ear: Tympanic membrane, ear canal and external ear normal.     Left Ear: Tympanic membrane, ear canal and external ear normal.     Nose: Nose normal. No congestion or rhinorrhea.     Mouth/Throat:     Mouth: Oropharynx is clear and moist. Mucous membranes are moist.  Eyes:     General:        Right eye: No discharge.        Left eye: No discharge.     Extraocular Movements: EOM normal.     Conjunctiva/sclera: Conjunctivae normal.     Pupils: Pupils are equal, round, and reactive to  light.  Neck:     Thyroid: No thyromegaly.     Vascular: No JVD.  Cardiovascular:     Rate and Rhythm: Normal rate and regular rhythm.     Pulses: Intact distal pulses.     Heart sounds: Normal heart sounds. No murmur heard. No friction rub. No gallop.   Pulmonary:     Effort: Pulmonary effort is normal.     Breath sounds: Normal breath sounds. No wheezing, rhonchi or rales.  Chest:     Chest wall: No tenderness.  Abdominal:     General: Bowel sounds are normal. There is no distension.     Palpations: Abdomen is soft. There is no mass.     Tenderness: There is no abdominal tenderness. There is no guarding or rebound.  Musculoskeletal:        General: No edema. Normal range of  motion.     Cervical back: Normal range of motion and neck supple.  Lymphadenopathy:     Cervical: No cervical adenopathy.  Skin:    General: Skin is warm and dry.  Neurological:     Mental Status: She is alert.     Deep Tendon Reflexes: Reflexes are normal and symmetric.     Wt Readings from Last 3 Encounters:  03/30/20 144 lb (65.3 kg)  01/17/20 145 lb (65.8 kg)  11/15/19 140 lb (63.5 kg)    BP 130/64   Pulse 68   Ht 5' (1.524 m)   Wt 144 lb (65.3 kg)   BMI 28.12 kg/m   Assessment and Plan:  1. Seasonal allergic rhinitis due to pollen Chronic.  Controlled.  Stable.  Continue fexofenadine 180 mg once a day.  Along with fluticasone nasal spray and Singulair 10 mg once a day. - fexofenadine (ALLEGRA) 180 MG tablet; Take 1 tablet (180 mg total) by mouth daily.  Dispense: 90 tablet; Refill: 1 - fluticasone (FLONASE) 50 MCG/ACT nasal spray; One puff each nostril q day  Dispense: 48 mL; Refill: 1 - montelukast (SINGULAIR) 10 MG tablet; TAKE 1 TABLET BY MOUTH EVERYDAY AT BEDTIME  Dispense: 90 tablet; Refill: 1  2. Essential hypertension .  Controlled.  Stable.  Blood pressure today is noted to be excellent at 130/64.  Continue losartan hydrochlorothiazide 100-25 once a day. -  losartan-hydrochlorothiazide (HYZAAR) 100-25 MG tablet; Take 1 tablet by mouth daily.  Dispense: 90 tablet; Refill: 1  3. Arthritis .  Controlled.  Stable.  Primary with hands has not been eating as much and is reading more and there is been some decrease in the pain due to the decrease in meticulous activity of the fingers.  Will continue meloxicam 15 mg on an as-needed basis once a day. - meloxicam (MOBIC) 15 MG tablet; Take 1 tablet (15 mg total) by mouth daily.  Dispense: 90 tablet; Refill: 1  4. Mild intermittent asthma without complication Chronic.  Controlled.  Stable.  Continue Singulair 1 at bedtime as needed - montelukast (SINGULAIR) 10 MG tablet; TAKE 1 TABLET BY MOUTH EVERYDAY AT BEDTIME  Dispense: 90 tablet; Refill: 1  5. Gastroesophageal reflux disease without esophagitis Chronic.  Controlled.  Stable.  Decrease in reflux is controlled on Prilosec 10 mg once a day. - omeprazole (PRILOSEC) 10 MG capsule; Take 1 capsule (10 mg total) by mouth daily.  Dispense: 90 capsule; Refill: 1  6. Restless leg syndrome Patient is no longer taking the Mirapex and we will hold on this at this time. - pramipexole (MIRAPEX) 0.125 MG tablet; Take 1 tablet (0.125 mg total) by mouth at bedtime.  Dispense: 90 tablet; Refill: 1  7. Familial multiple lipoprotein-type hyperlipidemia Chronic.  Controlled.  Stable.  Continue simvastatin 20 mg once a day. - simvastatin (ZOCOR) 20 MG tablet; Take 1 tablet (20 mg total) by mouth daily.  Dispense: 90 tablet; Refill: 1  8. Taking medication for chronic disease Patient is taking a statin we will check her hepatic function panel. - Hepatic function panel

## 2020-03-31 LAB — HEPATIC FUNCTION PANEL
ALT: 27 IU/L (ref 0–32)
AST: 26 IU/L (ref 0–40)
Albumin: 4.2 g/dL (ref 3.8–4.8)
Alkaline Phosphatase: 60 IU/L (ref 44–121)
Bilirubin Total: 0.3 mg/dL (ref 0.0–1.2)
Bilirubin, Direct: 0.11 mg/dL (ref 0.00–0.40)
Total Protein: 6.4 g/dL (ref 6.0–8.5)

## 2020-06-07 ENCOUNTER — Other Ambulatory Visit: Payer: Self-pay

## 2020-06-07 ENCOUNTER — Ambulatory Visit (INDEPENDENT_AMBULATORY_CARE_PROVIDER_SITE_OTHER): Payer: Medicare PPO

## 2020-06-07 VITALS — BP 138/72 | HR 72 | Temp 98.4°F | Resp 16 | Ht 60.0 in | Wt 140.8 lb

## 2020-06-07 DIAGNOSIS — Z0000 Encounter for general adult medical examination without abnormal findings: Secondary | ICD-10-CM | POA: Diagnosis not present

## 2020-06-07 DIAGNOSIS — Z1211 Encounter for screening for malignant neoplasm of colon: Secondary | ICD-10-CM | POA: Diagnosis not present

## 2020-06-07 DIAGNOSIS — Z780 Asymptomatic menopausal state: Secondary | ICD-10-CM

## 2020-06-07 DIAGNOSIS — Z23 Encounter for immunization: Secondary | ICD-10-CM | POA: Diagnosis not present

## 2020-06-07 NOTE — Progress Notes (Signed)
Subjective:   Kristin Mata is a 70 y.o. female who presents for an Initial Medicare Annual Wellness Visit.  Review of Systems     Cardiac Risk Factors include: advanced age (>91men, >46 women);hypertension;dyslipidemia     Objective:    Today's Vitals   06/07/20 1014  BP: 138/72  Pulse: 72  Resp: 16  Temp: 98.4 F (36.9 C)  TempSrc: Oral  SpO2: 98%  Weight: 140 lb 12.8 oz (63.9 kg)  Height: 5' (1.524 m)   Body mass index is 27.5 kg/m.  Advanced Directives 06/07/2020 11/15/2019 10/18/2019  Does Patient Have a Medical Advance Directive? Yes Yes Yes  Type of Paramedic of [place];Living will Living will;Healthcare Power of Attorney Living will  Does patient want to make changes to medical advance directive? - No - Patient declined No - Patient declined  Copy of [place] in Chart? No - copy requested Yes - validated most recent copy scanned in chart (See row information) -  Would patient like information on creating a medical advance directive? - No - Patient declined -    Current Medications (verified) Outpatient Encounter Medications as of 06/07/2020  Medication Sig  . albuterol (PROVENTIL HFA;VENTOLIN HFA) 108 (90 Base) MCG/ACT inhaler [place]  . ascorbic acid (VITAMIN C) 250 MG tablet Take 1 tablet by mouth daily.  Marland Kitchen aspirin EC 81 MG tablet Take 81 mg by mouth daily.  . Biotin 10 MG TABS Take 1 tablet by mouth daily.  Marland Kitchen CANNABIDIOL PO Take 3,000 mg by mouth daily.  . Cholecalciferol 25 MCG (1000 UT) tablet Take 1 tablet by mouth daily.  . ferrous sulfate 325 (65 FE) MG tablet Take 325 mg by mouth daily with breakfast.  . fexofenadine (ALLEGRA) 180 MG tablet Take 1 tablet (180 mg total) by mouth daily.  . fluticasone (FLONASE) 50 MCG/ACT nasal spray One puff each nostril q day  . fluticasone-salmeterol (ADVAIR) 100-50 MCG/ACT AEPB Inhale into the lungs.  . gabapentin (NEURONTIN) 100 MG capsule TAKE 1 CAPSULE BY MOUTH  THREE TIMES A DAY (Patient taking differently: 1 in am and 3 in pmManuella Ghazi)  . losartan-hydrochlorothiazide (HYZAAR) 100-25 MG tablet Take 1 tablet by mouth daily.  . magnesium oxide (MAG-OX) 400 MG tablet Take 400 mg by mouth daily.  . meloxicam (MOBIC) 15 MG tablet Take 1 tablet (15 mg total) by mouth daily.  . montelukast (SINGULAIR) 10 MG tablet TAKE 1 TABLET BY MOUTH EVERYDAY AT BEDTIME  . Multiple Vitamin (MULTIVITAMIN) tablet Take 1 tablet by mouth daily.  . Omega-3 Fatty Acids (FISH OIL) 1000 MG CAPS Take 1 capsule by mouth 2 (two) times daily.  Marland Kitchen omeprazole (PRILOSEC) 10 MG capsule Take 1 capsule (10 mg total) by mouth daily.  . simvastatin (ZOCOR) 20 MG tablet Take 1 tablet (20 mg total) by mouth daily.  . vitamin E 1000 UNIT capsule Take 1 capsule by mouth daily.  . [DISCONTINUED] Cholecalciferol (VITAMIN D PO) Take by mouth.  . [DISCONTINUED] Fluticasone-Salmeterol (ADVAIR) 250-50 MCG/DOSE AEPB Inhale 1 puff into the lungs daily. Dr [name]  . [DISCONTINUED] pramipexole (MIRAPEX) 0.125 MG tablet Take 1 tablet (0.125 mg total) by mouth at bedtime.  . [DISCONTINUED] TURMERIC CURCUMIN PO Take by mouth in the morning and at bedtime. Indrepta C   No facility-administered encounter medications on file as of 06/07/2020.    Allergies (verified) Patient has no known allergies.   History: Past Medical History:  Diagnosis Date  . Arthritis   .  Asthma   . Colon cancer ([place]) 2015   had chemo  . Degenerative disc disease, lumbar   . Hyperlipidemia   . Hypertension   . Neuropathy    hands and feet.  S/P chemo.  . Personal history of chemotherapy   . RLS (restless legs syndrome)    Past Surgical History:  Procedure Laterality Date  . ABDOMINAL HYSTERECTOMY    . BREAST CYST ASPIRATION Bilateral    neg  . CATARACT EXTRACTION W/PHACO Right 10/18/2019   Procedure: CATARACT EXTRACTION PHACO AND INTRAOCULAR LENS PLACEMENT (IOC) RIGHT 2.35  00:26.5;  Surgeon: Eulogio Bear, MD;   Location: [facility];  Service: Ophthalmology;  Laterality: Right;  . CATARACT EXTRACTION W/PHACO Left 11/15/2019   Procedure: CATARACT EXTRACTION PHACO AND INTRAOCULAR LENS PLACEMENT ([place]) LEFT;  Surgeon: Eulogio Bear, MD;  Location: [facility];  Service: Ophthalmology;  Laterality: Left;  1.50 0:19.1  . COLON RESECTION    . HAMMER TOE SURGERY    . HAND SURGERY    . HAND SURGERY     Thumb  . ROTATOR CUFF REPAIR Right    Family History  Problem Relation Age of Onset  . Breast cancer Sister 64   Social History   Socioeconomic History  . Marital status: Married    Spouse name: Not on file  . Number of children: 4  . Years of education: Not on file  . Highest education level: Associate degree: academic program  Occupational History  . Not on file  Tobacco Use  . Smoking status: Never Smoker  . Smokeless tobacco: Never Used  Vaping Use  . Vaping Use: Never used  Substance and Sexual Activity  . Alcohol use: Yes    Alcohol/week: 1.0 standard drink    Types: 1 Standard drinks or equivalent per week  . Drug use: No  . Sexual activity: Not Currently  Other Topics Concern  . Not on file  Social History Narrative  . Not on file   Social Determinants of Health   Financial Resource Strain: Low Risk   . Difficulty of Paying Living Expenses: Not hard at all  Food Insecurity: No Food Insecurity  . Worried About Charity fundraiser in the Last Year: Never true  . Ran Out of Food in the Last Year: Never true  Transportation Needs: No Transportation Needs  . Lack of Transportation (Medical): No  . Lack of Transportation (Non-Medical): No  Physical Activity: Inactive  . Days of Exercise per Week: 0 days  . Minutes of Exercise per Session: 0 min  Stress: No Stress Concern Present  . Feeling of Stress : Not at all  Social Connections: Moderately Integrated  . Frequency of Communication with Friends and Family: More than three times a week  . Frequency of  Social Gatherings with Friends and Family: More than three times a week  . Attends Religious Services: More than 4 times per year  . Active Member of Clubs or Organizations: No  . Attends Archivist Meetings: Never  . Marital Status: Married    Tobacco Counseling Counseling given: Not Answered   Clinical Intake:  Pre-visit preparation completed: Yes  Pain : No/denies pain     BMI - recorded: 27.5 Nutritional Status: BMI 25 -29 Overweight Nutritional Risks: None Diabetes: No  How often do you need to have someone help you when you read instructions, pamphlets, or other written materials from your doctor or pharmacy?: 1 - Never    Interpreter Needed?:  No  Information entered by :: Clemetine Marker LPN   Activities of Daily Living In your present state of health, do you have any difficulty performing the following activities: 06/07/2020 01/17/2020  Hearing? N N  Comment declines hearing aids -  Vision? N N  Difficulty concentrating or making decisions? N N  Walking or climbing stairs? N N  Dressing or bathing? N N  Doing errands, shopping? N N  Preparing Food and eating ? N -  Using the Toilet? N -  In the past six months, have you accidently leaked urine? N -  Do you have problems with loss of bowel control? N -  Managing your Medications? N -  Managing your Finances? N -  Housekeeping or managing your Housekeeping? N -  Some recent data might be hidden    Patient Care Team: Juline Patch, MD as PCP - General (Family Medicine)  Indicate any recent Medical Services you may have received from other than [facility] providers in the past year (date may be approximate).     Assessment:   This is a routine wellness examination for Kristin Mata.  Hearing/Vision screen  Hearing Screening   125Hz  250Hz  500Hz  1000Hz  2000Hz  3000Hz  4000Hz  6000Hz  8000Hz   Right ear:           Left ear:           Comments: Pt denies hearing difficulty  Vision Screening Comments: Annual  vision screenings done at [facility] issues and exercise activities discussed: Current Exercise Habits: The patient does not participate in regular exercise at present, Exercise limited by: None identified  Goals Addressed            This Visit's Progress   . Weight (lb) < 130 lb (59 kg)   140 lb 12.8 oz (63.9 kg)    Pt states she would like to lose weight over the next year with healthy eating and physical activity       Depression Screen PHQ 2/9 Scores 06/07/2020 03/30/2020 01/17/2020 09/29/2018 10/31/2016 07/18/2015 06/09/2015  PHQ - 2 Score 0 0 0 0 0 0 0  PHQ- 9 Score - 0 0 0 - - -    Fall Risk Fall Risk  06/07/2020 03/30/2020 01/17/2020 10/31/2016 07/18/2015  Falls in the past year? 0 0 0 No No  Number falls in past yr: 0 - - - -  Injury with Fall? 0 - - - -  Risk for fall due to : No Fall Risks - - - -  Follow up Falls prevention discussed Falls evaluation completed Falls evaluation completed - -    FALL RISK PREVENTION PERTAINING TO THE HOME:  Any stairs in or around the home? Yes  If so, are there any without handrails? No  Home free of loose throw rugs in walkways, pet beds, electrical cords, etc? Yes  Adequate lighting in your home to reduce risk of falls? Yes   ASSISTIVE DEVICES UTILIZED TO PREVENT FALLS:  Life alert? No  Use of a cane, walker or w/c? No  Grab bars in the bathroom? No  Shower chair or bench in shower? No  Elevated toilet seat or a handicapped toilet? No   TIMED UP AND GO:  Was the test performed? Yes .  Length of time to ambulate 10 feet: 5 sec.   Gait steady and fast without use of assistive device  Cognitive Function:        Immunizations Immunization History  Administered Date(s) Administered  .  PFIZER(Purple Top)SARS-COV-2 Vaccination 01/20/2019, 02/10/2019, 11/05/2019  . Pneumococcal Conjugate-13 09/29/2018  . Pneumococcal Polysaccharide-23 06/07/2020  . Tdap 01/05/2016  . Zoster Recombinat (Shingrix)  11/19/2018, 01/26/2019    TDAP status: Up to date  Flu vaccine status: n/a due hx of Guillain Barre syndrome  Pneumococcal vaccine status: Completed during today's visit.  Covid-19 vaccine status: Completed vaccines  Qualifies for Shingles Vaccine? Yes   Zostavax completed No   Shingrix Completed?: Yes  Screening Tests Health Maintenance  Topic Date Due  . COVID-19 Vaccine (4 - Booster for Pfizer series) 05/05/2020  . Hepatitis C Screening  09/29/2020 (Originally 10/21/1968)  . COLONOSCOPY (Pts 45-68yrs Insurance coverage will need to be confirmed)  06/09/2020  . MAMMOGRAM  12/20/2021  . TETANUS/TDAP  01/04/2026  . DEXA SCAN  Completed  . PNA vac Low Risk Adult  Completed  . HPV VACCINES  Aged Out  . INFLUENZA VACCINE  Discontinued    Health Maintenance  Health Maintenance Due  Topic Date Due  . COVID-19 Vaccine (4 - Booster for Pfizer series) 05/05/2020    Colorectal cancer screening: Type of screening: Colonoscopy. Completed 06/09/17. Repeat every 3 years  Mammogram status: Completed 12/21/19. Repeat every year  Bone Density status: Ordered today. Pt provided with contact info and advised to call to schedule appt.  Lung Cancer Screening: (Low Dose CT Chest recommended if Age 34-80 years, 30 pack-year currently smoking OR have quit w/in 15years.) does not qualify.   Additional Screening:  Hepatitis C Screening: does qualify; postponed  Vision Screening: Recommended annual ophthalmology exams for early detection of glaucoma and other disorders of the eye. Is the patient up to date with their annual eye exam?  Yes  Who is the provider or what is the name of the office in which the patient attends annual eye exams? [place] Screening: Recommended annual dental exams for proper oral hygiene  Community Resource Referral / Chronic Care Management: CRR required this visit?  No   CCM required this visit?  No      Plan:     I have personally  reviewed and noted the following in the patient's chart:   . Medical and social history . Use of alcohol, tobacco or illicit drugs  . Current medications and supplements including opioid prescriptions. Patient is not currently taking opioid prescriptions. . Functional ability and status . Nutritional status . Physical activity . Advanced directives . List of other physicians . Hospitalizations, surgeries, and ER visits in previous 12 months . Vitals . Screenings to include cognitive, depression, and falls . Referrals and appointments  In addition, I have reviewed and discussed with patient certain preventive protocols, quality metrics, and best practice recommendations. A written personalized care plan for preventive services as well as general preventive health recommendations were provided to patient.     Clemetine Marker, LPN   [id number]   Nurse Notes: none

## 2020-06-07 NOTE — Patient Instructions (Signed)
Kristin Mata , Thank you for taking time to come for your Medicare Wellness Visit. I appreciate your ongoing commitment to your health goals. Please review the following plan we discussed and let me know if I can assist you in the future.   Screening recommendations/referrals: Colonoscopy: done 06/09/17. Referral sent to [facility] for repeat screening colonoscopy. They will contact you for an appointment. Mammogram: done 12/21/19 Bone Density: Please call [phone] to schedule your bone density screening.  Recommended yearly ophthalmology/optometry visit for glaucoma screening and checkup Recommended yearly dental visit for hygiene and checkup  Vaccinations: Influenza vaccine: n/a Pneumococcal vaccine: done today Tdap vaccine: done 01/05/16 Shingles vaccine: done 11/19/18 & 01/26/19   Covid-19: done 01/20/19, 02/10/19 & 11/05/19  Advanced directives: Please bring a copy of your health care power of attorney and living will to the office at your convenience.  Conditions/risks identified: Recommend increasing physical activity to at least 3 days per week   Next appointment: Follow up in one year for your annual wellness visit    Preventive Care 65 Years and Older, Female Preventive care refers to lifestyle choices and visits with your health care provider that can promote health and wellness. What does preventive care include?  A yearly physical exam. This is also called an annual well check.  Dental exams once or twice a year.  Routine eye exams. Ask your health care provider how often you should have your eyes checked.  Personal lifestyle choices, including:  Daily care of your teeth and gums.  Regular physical activity.  Eating a healthy diet.  Avoiding tobacco and drug use.  Limiting alcohol use.  Practicing safe sex.  Taking low-dose aspirin every day.  Taking vitamin and mineral supplements as recommended by your health care provider. What happens  during an annual well check? The services and screenings done by your health care provider during your annual well check will depend on your age, overall health, lifestyle risk factors, and family history of disease. Counseling  Your health care provider may ask you questions about your:  Alcohol use.  Tobacco use.  Drug use.  Emotional well-being.  Home and relationship well-being.  Sexual activity.  Eating habits.  History of falls.  Memory and ability to understand (cognition).  Work and work Statistician.  Reproductive health. Screening  You may have the following tests or measurements:  Height, weight, and BMI.  Blood pressure.  Lipid and cholesterol levels. These may be checked every 5 years, or more frequently if you are over 68 years old.  Skin check.  Lung cancer screening. You may have this screening every year starting at age 32 if you have a 30-pack-year history of smoking and currently smoke or have quit within the past 15 years.  Fecal occult blood test (FOBT) of the stool. You may have this test every year starting at age 68.  Flexible sigmoidoscopy or colonoscopy. You may have a sigmoidoscopy every 5 years or a colonoscopy every 10 years starting at age 68.  Hepatitis C blood test.  Hepatitis B blood test.  Sexually transmitted disease (STD) testing.  Diabetes screening. This is done by checking your blood sugar (glucose) after you have not eaten for a while (fasting). You may have this done every 1-3 years.  Bone density scan. This is done to screen for osteoporosis. You may have this done starting at age 68.  Mammogram. This may be done every 1-2 years. Talk to your health care provider about how often you should  have regular mammograms. Talk with your health care provider about your test results, treatment options, and if necessary, the need for more tests. Vaccines  Your health care provider may recommend certain vaccines, such  as:  Influenza vaccine. This is recommended every year.  Tetanus, diphtheria, and acellular pertussis (Tdap, Td) vaccine. You may need a Td booster every 10 years.  Zoster vaccine. You may need this after age 15.  Pneumococcal 13-valent conjugate (PCV13) vaccine. One dose is recommended after age 32.  Pneumococcal polysaccharide (PPSV23) vaccine. One dose is recommended after age 57. Talk to your health care provider about which screenings and vaccines you need and how often you need them. This information is not intended to replace advice given to you by your health care provider. Make sure you discuss any questions you have with your health care provider. Document Released: 02/10/2015 Document Revised: 10/04/2015 Document Reviewed: 11/15/2014 Elsevier Interactive Patient Education  2017 [place] Prevention in the Home Falls can cause injuries. They can happen to people of all ages. There are many things you can do to make your home safe and to help prevent falls. What can I do on the outside of my home?  Regularly fix the edges of walkways and driveways and fix any cracks.  Remove anything that might make you trip as you walk through a door, such as a raised step or threshold.  Trim any bushes or trees on the path to your home.  Use bright outdoor lighting.  Clear any walking paths of anything that might make someone trip, such as rocks or tools.  Regularly check to see if handrails are loose or broken. Make sure that both sides of any steps have handrails.  Any raised decks and porches should have guardrails on the edges.  Have any leaves, snow, or ice cleared regularly.  Use sand or salt on walking paths during winter.  Clean up any spills in your garage right away. This includes oil or grease spills. What can I do in the bathroom?  Use night lights.  Install grab bars by the toilet and in the tub and shower. Do not use towel bars as grab bars.  Use  non-skid mats or decals in the tub or shower.  If you need to sit down in the shower, use a plastic, non-slip stool.  Keep the floor dry. Clean up any water that spills on the floor as soon as it happens.  Remove soap buildup in the tub or shower regularly.  Attach bath mats securely with double-sided non-slip rug tape.  Do not have throw rugs and other things on the floor that can make you trip. What can I do in the bedroom?  Use night lights.  Make sure that you have a light by your bed that is easy to reach.  Do not use any sheets or blankets that are too big for your bed. They should not hang down onto the floor.  Have a firm chair that has side arms. You can use this for support while you get dressed.  Do not have throw rugs and other things on the floor that can make you trip. What can I do in the kitchen?  Clean up any spills right away.  Avoid walking on wet floors.  Keep items that you use a lot in easy-to-reach places.  If you need to reach something above you, use a strong step stool that has a grab bar.  Keep electrical cords out of  the way.  Do not use floor polish or wax that makes floors slippery. If you must use wax, use non-skid floor wax.  Do not have throw rugs and other things on the floor that can make you trip. What can I do with my stairs?  Do not leave any items on the stairs.  Make sure that there are handrails on both sides of the stairs and use them. Fix handrails that are broken or loose. Make sure that handrails are as long as the stairways.  Check any carpeting to make sure that it is firmly attached to the stairs. Fix any carpet that is loose or worn.  Avoid having throw rugs at the top or bottom of the stairs. If you do have throw rugs, attach them to the floor with carpet tape.  Make sure that you have a light switch at the top of the stairs and the bottom of the stairs. If you do not have them, ask someone to add them for you. What  else can I do to help prevent falls?  Wear shoes that:  Do not have high heels.  Have rubber bottoms.  Are comfortable and fit you well.  Are closed at the toe. Do not wear sandals.  If you use a stepladder:  Make sure that it is fully opened. Do not climb a closed stepladder.  Make sure that both sides of the stepladder are locked into place.  Ask someone to hold it for you, if possible.  Clearly mark and make sure that you can see:  Any grab bars or handrails.  First and last steps.  Where the edge of each step is.  Use tools that help you move around (mobility aids) if they are needed. These include:  Canes.  Walkers.  Scooters.  Crutches.  Turn on the lights when you go into a dark area. Replace any light bulbs as soon as they burn out.  Set up your furniture so you have a clear path. Avoid moving your furniture around.  If any of your floors are uneven, fix them.  If there are any pets around you, be aware of where they are.  Review your medicines with your doctor. Some medicines can make you feel dizzy. This can increase your chance of falling. Ask your doctor what other things that you can do to help prevent falls. This information is not intended to replace advice given to you by your health care provider. Make sure you discuss any questions you have with your health care provider. Document Released: 11/10/2008 Document Revised: 06/22/2015 Document Reviewed: 02/18/2014 Elsevier Interactive Patient Education  2017 [employer]   Pneumococcal Polysaccharide Vaccine (PPSV23): What You Need to Know 1. Why get vaccinated? Pneumococcal polysaccharide vaccine (PPSV23) can prevent pneumococcal disease. Pneumococcal disease refers to any illness caused by pneumococcal bacteria. These bacteria can cause many types of illnesses, including pneumonia, which is an infection of the lungs. Pneumococcal bacteria are one of the most common causes of pneumonia. Besides  pneumonia, pneumococcal bacteria can also cause:  Ear infections  Sinus infections  Meningitis (infection of the tissue covering the brain and spinal cord)  Bacteremia (bloodstream infection) Anyone can get pneumococcal disease, but children under 60 years of age, people with certain medical conditions, adults 25 years or older, and cigarette smokers are at the highest risk. Most pneumococcal infections are mild. However, some can result in long-term problems, such as brain damage or hearing loss. Meningitis, bacteremia, and pneumonia caused by pneumococcal  disease can be fatal. 2. PPSV23 PPSV23 protects against 23 types of bacteria that cause pneumococcal disease. PPSV23 is recommended for:  All adults 8 years or older,  Anyone 2 years or older with certain medical conditions that can lead to an increased risk for pneumococcal disease. Most people need only one dose of PPSV23. A second dose of PPSV23, and another type of pneumococcal vaccine called PCV13, are recommended for certain high-risk groups. Your health care provider can give you more information. People 65 years or older should get a dose of PPSV23 even if they have already gotten one or more doses of the vaccine before they turned 71. 3. Talk with your health care provider Tell your vaccine provider if the person getting the vaccine:  Has had an allergic reaction after a previous dose of PPSV23, or has any severe, life-threatening allergies. In some cases, your health care provider may decide to postpone PPSV23 vaccination to a future visit. People with minor illnesses, such as a cold, may be vaccinated. People who are moderately or severely ill should usually wait until they recover before getting PPSV23. Your health care provider can give you more information. 4. Risks of a vaccine reaction  Redness or pain where the shot is given, feeling tired, fever, or muscle aches can happen after PPSV23. People sometimes faint after  medical procedures, including vaccination. Tell your provider if you feel dizzy or have vision changes or ringing in the ears. As with any medicine, there is a very remote chance of a vaccine causing a severe allergic reaction, other serious injury, or death. 5. What if there is a serious problem? An allergic reaction could occur after the vaccinated person leaves the clinic. If you see signs of a severe allergic reaction (hives, swelling of the face and throat, difficulty breathing, a fast heartbeat, dizziness, or weakness), call 9-1-1 and get the person to the nearest hospital. For other signs that concern you, call your health care provider. Adverse reactions should be reported to the Vaccine Adverse Event Reporting System (VAERS). Your health care provider will usually file this report, or you can do it yourself. Visit the VAERS website at www.vaers.SamedayNews.es or call 732-295-9191. VAERS is only for reporting reactions, and VAERS staff do not give medical advice. 6. How can I learn more?  Ask your health care provider.  Call your local or state health department.  Contact the Centers for Disease Control and Prevention (CDC): ? Call (847)505-9352 (1-800-CDC-INFO) or ? Visit CDC's website at http://hunter.com/ Vaccine Information Statement PPSV23 Vaccine (11/26/2017) This information is not intended to replace advice given to you by your health care provider. Make sure you discuss any questions you have with your health care provider. Document Revised: 09/17/2019 Document Reviewed: 09/17/2019 Elsevier Patient Education  [place].

## 2020-06-13 DIAGNOSIS — J439 Emphysema, unspecified: Secondary | ICD-10-CM | POA: Diagnosis not present

## 2020-06-13 DIAGNOSIS — R918 Other nonspecific abnormal finding of lung field: Secondary | ICD-10-CM | POA: Diagnosis not present

## 2020-07-24 DIAGNOSIS — G608 Other hereditary and idiopathic neuropathies: Secondary | ICD-10-CM | POA: Diagnosis not present

## 2020-07-24 DIAGNOSIS — R208 Other disturbances of skin sensation: Secondary | ICD-10-CM | POA: Diagnosis not present

## 2020-07-24 DIAGNOSIS — M5412 Radiculopathy, cervical region: Secondary | ICD-10-CM | POA: Diagnosis not present

## 2020-07-25 ENCOUNTER — Ambulatory Visit
Admission: RE | Admit: 2020-07-25 | Discharge: 2020-07-25 | Disposition: A | Discharge status: Home / Self Care | Payer: Medicare PPO | Source: Ambulatory Visit | Attending: Family Medicine | Admitting: Family Medicine

## 2020-07-25 ENCOUNTER — Other Ambulatory Visit: Payer: Self-pay

## 2020-07-25 DIAGNOSIS — Z780 Asymptomatic menopausal state: Secondary | ICD-10-CM | POA: Insufficient documentation

## 2020-07-25 DIAGNOSIS — M85851 Other specified disorders of bone density and structure, right thigh: Secondary | ICD-10-CM | POA: Diagnosis not present

## 2020-07-27 ENCOUNTER — Other Ambulatory Visit: Payer: Self-pay | Admitting: Family Medicine

## 2020-07-27 DIAGNOSIS — M1990 Unspecified osteoarthritis, unspecified site: Secondary | ICD-10-CM

## 2020-07-27 DIAGNOSIS — J301 Allergic rhinitis due to pollen: Secondary | ICD-10-CM

## 2020-07-27 DIAGNOSIS — J4520 Mild intermittent asthma, uncomplicated: Secondary | ICD-10-CM

## 2020-07-27 DIAGNOSIS — T451X5A Adverse effect of antineoplastic and immunosuppressive drugs, initial encounter: Secondary | ICD-10-CM

## 2020-07-27 NOTE — Telephone Encounter (Signed)
Notes to clinic:  Patient has upcoming appt on 09/19/2020 Script filled on 07/05/2020 Review for future refills   Requested Prescriptions  Pending Prescriptions Disp Refills   meloxicam (MOBIC) 15 MG tablet [Pharmacy Med Name: MELOXICAM 15 MG TABLET] 90 tablet 1    Sig: TAKE 1 TABLET (15 MG TOTAL) BY MOUTH DAILY.      Analgesics:  COX2 Inhibitors Failed - 07/27/2020  9:19 AM      Failed - HGB in normal range and within 360 days    HGB  Date Value Ref Range Status  03/12/2013 8.2 (L) 12.0 - 16.0 g/dL Final          Passed - Cr in normal range and within 360 days    Creatinine  Date Value Ref Range Status  03/11/2013 0.68 0.60 - 1.30 mg/dL Final   Creatinine, Ser  Date Value Ref Range Status  09/30/2019 0.57 0.57 - 1.00 mg/dL Final          Passed - Patient is not pregnant      Passed - Valid encounter within last 12 months    Recent Outpatient Visits           3 months ago Taking medication for chronic disease   [facility] Juline Patch, MD   6 months ago Acute non-recurrent sinusitis of other sinus   [facility] Juline Patch, MD   10 months ago Restless leg syndrome   [facility] Juline Patch, MD   1 year ago Essential hypertension   [facility] Juline Patch, MD   1 year ago Essential hypertension   [facility] Juline Patch, MD       Future Appointments             In 1 month Juline Patch, MD [facility]               gabapentin (NEURONTIN) 100 MG capsule [Pharmacy Med Name: GABAPENTIN 100 MG CAPSULE] 270 capsule 1    Sig: TAKE 1 [place]      Neurology: Anticonvulsants - gabapentin Passed - 07/27/2020  9:19 AM      Passed - Valid encounter within last 12 months    Recent Outpatient Visits           3 months ago Taking medication for chronic disease   [facility] Juline Patch, MD   6 months ago Acute non-recurrent sinusitis of  other sinus   [facility] Juline Patch, MD   10 months ago Restless leg syndrome   [facility] Juline Patch, MD   1 year ago Essential hypertension   [facility] Juline Patch, MD   1 year ago Essential hypertension   [facility] Juline Patch, MD       Future Appointments             In 1 month Juline Patch, MD [facility]               montelukast (SINGULAIR) 10 MG tablet [Pharmacy Med Name: MONTELUKAST SOD 10 MG TABLET] 90 tablet 1    Sig: TAKE 1 TABLET BY MOUTH EVERYDAY AT BEDTIME      Pulmonology:  Leukotriene Inhibitors Passed - 07/27/2020  9:19 AM      Passed - Valid encounter within last 12 months  Recent Outpatient Visits           3 months ago Taking medication for chronic disease   [facility] Juline Patch, MD   6 months ago Acute non-recurrent sinusitis of other sinus   [facility] Juline Patch, MD   10 months ago Restless leg syndrome   [facility] Juline Patch, MD   1 year ago Essential hypertension   [place], Deanna C, MD   1 year ago Essential hypertension   [place], Deanna C, MD       Future Appointments             In 1 month Juline Patch, MD [facility], [facility]

## 2020-07-28 DIAGNOSIS — R200 Anesthesia of skin: Secondary | ICD-10-CM | POA: Diagnosis not present

## 2020-07-28 DIAGNOSIS — M9901 Segmental and somatic dysfunction of cervical region: Secondary | ICD-10-CM | POA: Diagnosis not present

## 2020-07-28 DIAGNOSIS — M9902 Segmental and somatic dysfunction of thoracic region: Secondary | ICD-10-CM | POA: Diagnosis not present

## 2020-07-28 DIAGNOSIS — M25511 Pain in right shoulder: Secondary | ICD-10-CM | POA: Diagnosis not present

## 2020-07-28 DIAGNOSIS — M62838 Other muscle spasm: Secondary | ICD-10-CM | POA: Diagnosis not present

## 2020-07-28 DIAGNOSIS — M531 Cervicobrachial syndrome: Secondary | ICD-10-CM | POA: Diagnosis not present

## 2020-07-28 DIAGNOSIS — M5415 Radiculopathy, thoracolumbar region: Secondary | ICD-10-CM | POA: Diagnosis not present

## 2020-07-28 DIAGNOSIS — M79641 Pain in right hand: Secondary | ICD-10-CM | POA: Diagnosis not present

## 2020-08-08 ENCOUNTER — Telehealth: Payer: Medicare PPO | Admitting: Gastroenterology

## 2020-08-09 DIAGNOSIS — M4722 Other spondylosis with radiculopathy, cervical region: Secondary | ICD-10-CM | POA: Diagnosis not present

## 2020-08-09 DIAGNOSIS — R208 Other disturbances of skin sensation: Secondary | ICD-10-CM | POA: Diagnosis not present

## 2020-08-09 DIAGNOSIS — M5412 Radiculopathy, cervical region: Secondary | ICD-10-CM | POA: Diagnosis not present

## 2020-08-09 DIAGNOSIS — G96191 Perineural cyst: Secondary | ICD-10-CM | POA: Diagnosis not present

## 2020-08-09 DIAGNOSIS — M4802 Spinal stenosis, cervical region: Secondary | ICD-10-CM | POA: Diagnosis not present

## 2020-08-18 DIAGNOSIS — M79641 Pain in right hand: Secondary | ICD-10-CM | POA: Diagnosis not present

## 2020-08-18 DIAGNOSIS — M62838 Other muscle spasm: Secondary | ICD-10-CM | POA: Diagnosis not present

## 2020-08-18 DIAGNOSIS — M5415 Radiculopathy, thoracolumbar region: Secondary | ICD-10-CM | POA: Diagnosis not present

## 2020-08-18 DIAGNOSIS — M25511 Pain in right shoulder: Secondary | ICD-10-CM | POA: Diagnosis not present

## 2020-08-18 DIAGNOSIS — M531 Cervicobrachial syndrome: Secondary | ICD-10-CM | POA: Diagnosis not present

## 2020-08-18 DIAGNOSIS — M9901 Segmental and somatic dysfunction of cervical region: Secondary | ICD-10-CM | POA: Diagnosis not present

## 2020-08-18 DIAGNOSIS — R200 Anesthesia of skin: Secondary | ICD-10-CM | POA: Diagnosis not present

## 2020-08-18 DIAGNOSIS — M9902 Segmental and somatic dysfunction of thoracic region: Secondary | ICD-10-CM | POA: Diagnosis not present

## 2020-08-22 DIAGNOSIS — M531 Cervicobrachial syndrome: Secondary | ICD-10-CM | POA: Diagnosis not present

## 2020-08-22 DIAGNOSIS — G608 Other hereditary and idiopathic neuropathies: Secondary | ICD-10-CM | POA: Diagnosis not present

## 2020-08-22 DIAGNOSIS — M62838 Other muscle spasm: Secondary | ICD-10-CM | POA: Diagnosis not present

## 2020-08-22 DIAGNOSIS — R200 Anesthesia of skin: Secondary | ICD-10-CM | POA: Diagnosis not present

## 2020-08-22 DIAGNOSIS — M25511 Pain in right shoulder: Secondary | ICD-10-CM | POA: Diagnosis not present

## 2020-08-22 DIAGNOSIS — M5415 Radiculopathy, thoracolumbar region: Secondary | ICD-10-CM | POA: Diagnosis not present

## 2020-08-22 DIAGNOSIS — R208 Other disturbances of skin sensation: Secondary | ICD-10-CM | POA: Diagnosis not present

## 2020-08-22 DIAGNOSIS — M9901 Segmental and somatic dysfunction of cervical region: Secondary | ICD-10-CM | POA: Diagnosis not present

## 2020-08-22 DIAGNOSIS — M9902 Segmental and somatic dysfunction of thoracic region: Secondary | ICD-10-CM | POA: Diagnosis not present

## 2020-08-22 DIAGNOSIS — E538 Deficiency of other specified B group vitamins: Secondary | ICD-10-CM | POA: Diagnosis not present

## 2020-08-22 DIAGNOSIS — M79641 Pain in right hand: Secondary | ICD-10-CM | POA: Diagnosis not present

## 2020-08-24 DIAGNOSIS — R200 Anesthesia of skin: Secondary | ICD-10-CM | POA: Diagnosis not present

## 2020-08-24 DIAGNOSIS — M9902 Segmental and somatic dysfunction of thoracic region: Secondary | ICD-10-CM | POA: Diagnosis not present

## 2020-08-24 DIAGNOSIS — M79641 Pain in right hand: Secondary | ICD-10-CM | POA: Diagnosis not present

## 2020-08-24 DIAGNOSIS — M62838 Other muscle spasm: Secondary | ICD-10-CM | POA: Diagnosis not present

## 2020-08-24 DIAGNOSIS — M531 Cervicobrachial syndrome: Secondary | ICD-10-CM | POA: Diagnosis not present

## 2020-08-24 DIAGNOSIS — M5415 Radiculopathy, thoracolumbar region: Secondary | ICD-10-CM | POA: Diagnosis not present

## 2020-08-24 DIAGNOSIS — M25511 Pain in right shoulder: Secondary | ICD-10-CM | POA: Diagnosis not present

## 2020-08-24 DIAGNOSIS — M9901 Segmental and somatic dysfunction of cervical region: Secondary | ICD-10-CM | POA: Diagnosis not present

## 2020-08-31 DIAGNOSIS — M9901 Segmental and somatic dysfunction of cervical region: Secondary | ICD-10-CM | POA: Diagnosis not present

## 2020-08-31 DIAGNOSIS — M9902 Segmental and somatic dysfunction of thoracic region: Secondary | ICD-10-CM | POA: Diagnosis not present

## 2020-08-31 DIAGNOSIS — M531 Cervicobrachial syndrome: Secondary | ICD-10-CM | POA: Diagnosis not present

## 2020-08-31 DIAGNOSIS — M25511 Pain in right shoulder: Secondary | ICD-10-CM | POA: Diagnosis not present

## 2020-08-31 DIAGNOSIS — M62838 Other muscle spasm: Secondary | ICD-10-CM | POA: Diagnosis not present

## 2020-08-31 DIAGNOSIS — M5415 Radiculopathy, thoracolumbar region: Secondary | ICD-10-CM | POA: Diagnosis not present

## 2020-08-31 DIAGNOSIS — M79641 Pain in right hand: Secondary | ICD-10-CM | POA: Diagnosis not present

## 2020-08-31 DIAGNOSIS — R200 Anesthesia of skin: Secondary | ICD-10-CM | POA: Diagnosis not present

## 2020-09-04 DIAGNOSIS — M9901 Segmental and somatic dysfunction of cervical region: Secondary | ICD-10-CM | POA: Diagnosis not present

## 2020-09-04 DIAGNOSIS — M531 Cervicobrachial syndrome: Secondary | ICD-10-CM | POA: Diagnosis not present

## 2020-09-04 DIAGNOSIS — M9902 Segmental and somatic dysfunction of thoracic region: Secondary | ICD-10-CM | POA: Diagnosis not present

## 2020-09-04 DIAGNOSIS — M25511 Pain in right shoulder: Secondary | ICD-10-CM | POA: Diagnosis not present

## 2020-09-04 DIAGNOSIS — M79641 Pain in right hand: Secondary | ICD-10-CM | POA: Diagnosis not present

## 2020-09-04 DIAGNOSIS — M5415 Radiculopathy, thoracolumbar region: Secondary | ICD-10-CM | POA: Diagnosis not present

## 2020-09-04 DIAGNOSIS — R200 Anesthesia of skin: Secondary | ICD-10-CM | POA: Diagnosis not present

## 2020-09-04 DIAGNOSIS — M62838 Other muscle spasm: Secondary | ICD-10-CM | POA: Diagnosis not present

## 2020-09-07 DIAGNOSIS — M62838 Other muscle spasm: Secondary | ICD-10-CM | POA: Diagnosis not present

## 2020-09-07 DIAGNOSIS — M79641 Pain in right hand: Secondary | ICD-10-CM | POA: Diagnosis not present

## 2020-09-07 DIAGNOSIS — M531 Cervicobrachial syndrome: Secondary | ICD-10-CM | POA: Diagnosis not present

## 2020-09-07 DIAGNOSIS — M5415 Radiculopathy, thoracolumbar region: Secondary | ICD-10-CM | POA: Diagnosis not present

## 2020-09-07 DIAGNOSIS — M9901 Segmental and somatic dysfunction of cervical region: Secondary | ICD-10-CM | POA: Diagnosis not present

## 2020-09-07 DIAGNOSIS — M25511 Pain in right shoulder: Secondary | ICD-10-CM | POA: Diagnosis not present

## 2020-09-07 DIAGNOSIS — R200 Anesthesia of skin: Secondary | ICD-10-CM | POA: Diagnosis not present

## 2020-09-07 DIAGNOSIS — M9902 Segmental and somatic dysfunction of thoracic region: Secondary | ICD-10-CM | POA: Diagnosis not present

## 2020-09-14 DIAGNOSIS — R200 Anesthesia of skin: Secondary | ICD-10-CM | POA: Diagnosis not present

## 2020-09-14 DIAGNOSIS — M79641 Pain in right hand: Secondary | ICD-10-CM | POA: Diagnosis not present

## 2020-09-14 DIAGNOSIS — M9901 Segmental and somatic dysfunction of cervical region: Secondary | ICD-10-CM | POA: Diagnosis not present

## 2020-09-14 DIAGNOSIS — M62838 Other muscle spasm: Secondary | ICD-10-CM | POA: Diagnosis not present

## 2020-09-14 DIAGNOSIS — M9902 Segmental and somatic dysfunction of thoracic region: Secondary | ICD-10-CM | POA: Diagnosis not present

## 2020-09-14 DIAGNOSIS — M25511 Pain in right shoulder: Secondary | ICD-10-CM | POA: Diagnosis not present

## 2020-09-14 DIAGNOSIS — M531 Cervicobrachial syndrome: Secondary | ICD-10-CM | POA: Diagnosis not present

## 2020-09-14 DIAGNOSIS — M5415 Radiculopathy, thoracolumbar region: Secondary | ICD-10-CM | POA: Diagnosis not present

## 2020-09-19 ENCOUNTER — Encounter: Payer: Self-pay | Admitting: Family Medicine

## 2020-09-19 ENCOUNTER — Other Ambulatory Visit: Payer: Self-pay

## 2020-09-19 ENCOUNTER — Ambulatory Visit: Payer: Medicare PPO | Admitting: Family Medicine

## 2020-09-19 VITALS — BP 130/80 | HR 80 | Ht <= 58 in | Wt 140.0 lb

## 2020-09-19 DIAGNOSIS — I10 Essential (primary) hypertension: Secondary | ICD-10-CM | POA: Diagnosis not present

## 2020-09-19 DIAGNOSIS — J4520 Mild intermittent asthma, uncomplicated: Secondary | ICD-10-CM

## 2020-09-19 DIAGNOSIS — M1990 Unspecified osteoarthritis, unspecified site: Secondary | ICD-10-CM | POA: Diagnosis not present

## 2020-09-19 DIAGNOSIS — K219 Gastro-esophageal reflux disease without esophagitis: Secondary | ICD-10-CM | POA: Diagnosis not present

## 2020-09-19 DIAGNOSIS — E7849 Other hyperlipidemia: Secondary | ICD-10-CM | POA: Diagnosis not present

## 2020-09-19 DIAGNOSIS — J301 Allergic rhinitis due to pollen: Secondary | ICD-10-CM | POA: Diagnosis not present

## 2020-09-19 MED ORDER — MONTELUKAST SODIUM 10 MG PO TABS: ORAL_TABLET | ORAL | 1 refills | Status: DC

## 2020-09-19 MED ORDER — FLUTICASONE PROPIONATE 50 MCG/ACT NA SUSP: NASAL | 1 refills | Status: DC

## 2020-09-19 MED ORDER — OMEPRAZOLE 10 MG PO CPDR: 10.0000 mg | DELAYED_RELEASE_CAPSULE | Freq: Every day | ORAL | 1 refills | Status: DC

## 2020-09-19 MED ORDER — SIMVASTATIN 20 MG PO TABS: 20.0000 mg | ORAL_TABLET | Freq: Every day | ORAL | 1 refills | Status: DC

## 2020-09-19 MED ORDER — LOSARTAN POTASSIUM-HCTZ 100-25 MG PO TABS
1.0000 | ORAL_TABLET | Freq: Every day | ORAL | 1 refills | Status: DC
Start: 2020-09-19 — End: 2021-03-21

## 2020-09-19 MED ORDER — FEXOFENADINE HCL 180 MG PO TABS: 180.0000 mg | ORAL_TABLET | Freq: Every day | ORAL | 1 refills | Status: DC

## 2020-09-19 MED ORDER — MELOXICAM 15 MG PO TABS: 15.0000 mg | ORAL_TABLET | Freq: Every day | ORAL | 0 refills | Status: DC

## 2020-09-19 NOTE — Progress Notes (Signed)
Date:  09/19/2020   Name:  Kristin Mata   DOB:  1950/06/04   MRN:  [id number]   Chief Complaint: Hyperlipidemia, Gastroesophageal Reflux, Allergic Rhinitis , Asthma, Arthritis, and Hypertension  Hyperlipidemia This is a chronic problem. The current episode started more than 1 year ago. The problem is controlled. Recent lipid tests were reviewed and are normal. She has no history of chronic renal disease, diabetes, hypothyroidism, liver disease or nephrotic syndrome. Pertinent negatives include no chest pain, leg pain, myalgias or shortness of breath. There are no compliance problems.  Risk factors for coronary artery disease include dyslipidemia, hypertension and obesity.  Gastroesophageal Reflux She reports no abdominal pain, no belching, no chest pain, no choking, no coughing, no dysphagia, no nausea, no sore throat or no wheezing. The problem has been gradually improving. The symptoms are aggravated by certain foods. She has tried a PPI for the symptoms. The treatment provided moderate relief.  Arthritis Presents for follow-up visit. She complains of stiffness. She reports no pain, joint swelling or joint warmth. The symptoms have been stable. Pertinent negatives include no diarrhea, dysuria, fever or rash.  Hypertension This is a chronic problem. The current episode started more than 1 year ago. The problem has been gradually improving since onset. The problem is controlled. Pertinent negatives include no anxiety, blurred vision, chest pain, headaches, neck pain, orthopnea, palpitations, peripheral edema or shortness of breath. There are no associated agents to hypertension. Risk factors for coronary artery disease include dyslipidemia. Past treatments include angiotensin blockers and diuretics. The current treatment provides moderate improvement. There are no compliance problems.  There is no history of angina, kidney disease, CAD/MI, CVA, heart failure, left ventricular hypertrophy, PVD  or retinopathy. There is no history of chronic renal disease.  URI  This is a new (for allergic rhinitis) problem. The current episode started more than 1 year ago. The problem has been gradually improving. Pertinent negatives include no abdominal pain, chest pain, coughing, diarrhea, dysuria, ear pain, headaches, nausea, neck pain, rash, sore throat or wheezing.   Lab Results  Component Value Date   CREATININE 0.57 09/30/2019   BUN 14 09/30/2019   NA 140 09/30/2019   K 4.4 09/30/2019   CL 101 09/30/2019   CO2 25 09/30/2019   Lab Results  Component Value Date   CHOL 170 09/30/2019   HDL 59 09/30/2019   LDLCALC 99 09/30/2019   TRIG 63 09/30/2019   CHOLHDL 3.1 08/15/2017   No results found for: TSH No results found for: HGBA1C Lab Results  Component Value Date   WBC 8.4 03/11/2013   HGB 8.2 (L) 03/12/2013   HCT 21.5 (L) 03/11/2013   MCV 85 03/11/2013   PLT 395 03/11/2013   Lab Results  Component Value Date   ALT 27 03/30/2020   AST 26 03/30/2020   ALKPHOS 60 03/30/2020   BILITOT 0.3 03/30/2020     Review of Systems  Constitutional:  Negative for chills and fever.  HENT:  Negative for drooling, ear discharge, ear pain and sore throat.   Eyes:  Negative for blurred vision.  Respiratory:  Negative for cough, choking, chest tightness, shortness of breath and wheezing.   Cardiovascular:  Negative for chest pain, palpitations, orthopnea and leg swelling.  Gastrointestinal:  Negative for abdominal pain, blood in stool, constipation, diarrhea, dysphagia and nausea.  Endocrine: Negative for polydipsia.  Genitourinary:  Negative for dysuria, frequency, hematuria and urgency.  Musculoskeletal:  Positive for arthritis and stiffness. Negative for back  pain, joint swelling, myalgias and neck pain.  Skin:  Negative for rash.  Allergic/Immunologic: Negative for environmental allergies.  Neurological:  Negative for dizziness and headaches.  Hematological:  Does not bruise/bleed  easily.  Psychiatric/Behavioral:  Negative for suicidal ideas. The patient is not nervous/anxious.    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   GERD (gastroesophageal reflux disease) 04/09/2013   GBS (Guillain Barre syndrome) ([place]) 04/09/2013   DDD (degenerative disc disease), lumbar 04/09/2013   Colon cancer ([place]) 04/09/2013   Anemia 04/09/2013   Arthritis 03/13/2013    No Known Allergies  Past Surgical History:  Procedure Laterality Date   ABDOMINAL HYSTERECTOMY     BREAST CYST ASPIRATION Bilateral    neg   CATARACT EXTRACTION W/PHACO Right 10/18/2019   Procedure: CATARACT EXTRACTION PHACO AND INTRAOCULAR LENS PLACEMENT ([place]) RIGHT 2.35  00:26.5;  Surgeon: Eulogio Bear, MD;  Location: [facility];  Service: Ophthalmology;  Laterality: Right;   CATARACT EXTRACTION W/PHACO Left 11/15/2019   Procedure: CATARACT EXTRACTION PHACO AND INTRAOCULAR LENS PLACEMENT ([place]) LEFT;  Surgeon: Eulogio Bear, MD;  Location: [facility];  Service: Ophthalmology;  Laterality: Left;  1.50 0:19.1   COLON RESECTION     HAMMER TOE SURGERY     HAND SURGERY     HAND SURGERY     Thumb   ROTATOR CUFF REPAIR Right     Social History   Tobacco Use   Smoking status: Never   Smokeless tobacco: Never  Vaping Use   Vaping Use: Never used  Substance Use Topics   Alcohol use: Yes    Alcohol/week: 1.0 standard drink    Types: 1 Standard drinks or equivalent per week   Drug use: No     Medication list has been reviewed and updated.  Current Meds  Medication Sig   albuterol (PROVENTIL HFA;VENTOLIN HFA) 108 (90 Base) MCG/ACT inhaler [facility]   ascorbic acid (VITAMIN C) 250 MG tablet Take 1 tablet by mouth daily.   aspirin EC 81 MG tablet Take 81 mg by mouth daily.   Biotin 10 MG TABS Take 1 tablet by mouth daily.    CANNABIDIOL PO Take 3,000 mg by mouth daily.   Cholecalciferol 25 MCG (1000 UT) tablet Take 1 tablet by mouth daily.   ferrous sulfate 325 (65 FE) MG tablet Take 325 mg by mouth daily with breakfast.   fexofenadine (ALLEGRA) 180 MG tablet Take 1 tablet (180 mg total) by mouth daily.   fluticasone (FLONASE) 50 MCG/ACT nasal spray One puff each nostril q day   fluticasone-salmeterol (ADVAIR) 100-50 MCG/ACT AEPB Inhale into the lungs.   gabapentin (NEURONTIN) 100 MG capsule TAKE 1 CAPSULE BY MOUTH THREE TIMES A DAY (Patient taking differently: 1 in am and 300 in pm- Shah)   losartan-hydrochlorothiazide (HYZAAR) 100-25 MG tablet Take 1 tablet by mouth daily.   magnesium 30 MG tablet Take 30 mg by mouth daily.   magnesium oxide (MAG-OX) 400 MG tablet Take 400 mg by mouth daily.   meloxicam (MOBIC) 15 MG tablet TAKE 1 TABLET (15 MG TOTAL) BY MOUTH DAILY.   montelukast (SINGULAIR) 10 MG tablet TAKE 1 TABLET BY MOUTH EVERYDAY AT BEDTIME   Multiple Vitamin (MULTIVITAMIN) tablet Take 1 tablet by mouth daily.   Omega-3 Fatty Acids (FISH OIL) 1000 MG CAPS Take 1 capsule by mouth 2 (two) times daily.  omeprazole (PRILOSEC) 10 MG capsule Take 1 capsule (10 mg total) by mouth daily.   simvastatin (ZOCOR) 20 MG tablet Take 1 tablet (20 mg total) by mouth daily.   vitamin E 1000 UNIT capsule Take 1 capsule by mouth daily.    PHQ 2/9 Scores 06/07/2020 03/30/2020 01/17/2020 09/29/2018  PHQ - 2 Score 0 0 0 0  PHQ- 9 Score - 0 0 0    GAD 7 : Generalized Anxiety Score 03/30/2020  Nervous, Anxious, on Edge 0  Control/stop worrying 0  Worry too much - different things 0  Trouble relaxing 0  Restless 0  Easily annoyed or irritable 0  Afraid - awful might happen 0  Total GAD 7 Score 0    BP Readings from Last 3 Encounters:  06/07/20 138/72  03/30/20 130/64  01/17/20 130/80    Physical Exam Vitals and nursing note reviewed.  Constitutional:      General: She is not in acute distress.    Appearance:  She is not diaphoretic.  HENT:     Head: Normocephalic and atraumatic.     Right Ear: External ear normal.     Left Ear: External ear normal.     Nose: Nose normal.  Eyes:     General:        Right eye: No discharge.        Left eye: No discharge.     Conjunctiva/sclera: Conjunctivae normal.     Pupils: Pupils are equal, round, and reactive to light.  Neck:     Thyroid: No thyromegaly.     Vascular: No JVD.  Cardiovascular:     Rate and Rhythm: Normal rate and regular rhythm.     Heart sounds: Normal heart sounds, S1 normal and S2 normal. No murmur heard. No systolic murmur is present.  No diastolic murmur is present.    No friction rub. No gallop. No S3 or S4 sounds.  Pulmonary:     Effort: Pulmonary effort is normal.     Breath sounds: Normal breath sounds. No stridor. No wheezing or rhonchi.  Abdominal:     General: Bowel sounds are normal.     Palpations: Abdomen is soft. There is no mass.     Tenderness: There is no abdominal tenderness. There is no guarding.  Musculoskeletal:        General: Normal range of motion.     Cervical back: Normal range of motion and neck supple.  Lymphadenopathy:     Cervical: No cervical adenopathy.  Skin:    General: Skin is warm and dry.  Neurological:     Mental Status: She is alert.     Deep Tendon Reflexes: Reflexes are normal and symmetric.    Wt Readings from Last 3 Encounters:  09/19/20 140 lb (63.5 kg)  06/07/20 140 lb 12.8 oz (63.9 kg)  03/30/20 144 lb (65.3 kg)    Ht '4\' 10"'$  (1.473 m)   Wt 140 lb (63.5 kg)   BMI 29.26 kg/m   Assessment and Plan:  1. Essential hypertension Chronic.  Controlled.  Stable.  Blood pressure today 130/80.  Continue losartan hydrochlorothiazide 100-25 mg once a day and we will check renal function panel. - losartan-hydrochlorothiazide (HYZAAR) 100-25 MG tablet; Take 1 tablet by mouth daily.  Dispense: 90 tablet; Refill: 1 - Renal Function Panel  2. Seasonal allergic rhinitis due to  pollen Chronic.  Episodic.  Seasonal.  Controlled with combination of Allegra 180 mg once a day/Flonase 1 puff each nostril once  a day/and Singulair 10 mg once a day. - fexofenadine (ALLEGRA) 180 MG tablet; Take 1 tablet (180 mg total) by mouth daily.  Dispense: 90 tablet; Refill: 1 - fluticasone (FLONASE) 50 MCG/ACT nasal spray; One puff each nostril q day  Dispense: 48 mL; Refill: 1 - montelukast (SINGULAIR) 10 MG tablet; Take 1 tablet by mouth daily  Dispense: 90 tablet; Refill: 1  3. Arthritis Chronic.  Controlled.  Stable.  Primarily involving the back and hands.  Controlled with Mobic 15 mg once a day. - meloxicam (MOBIC) 15 MG tablet; Take 1 tablet (15 mg total) by mouth daily.  Dispense: 90 tablet; Refill: 0  4. Mild intermittent asthma without complication Chronic.  Controlled.  Stable.  Patient is followed by Dr. [name].  We will continue on Singulair for her asthma as well as her inhalers per pulmonology. - montelukast (SINGULAIR) 10 MG tablet; Take 1 tablet by mouth daily  Dispense: 90 tablet; Refill: 1  5. Gastroesophageal reflux disease without esophagitis Chronic.  Controlled.  Stable.  Continue omeprazole 10 mg once a day. - omeprazole (PRILOSEC) 10 MG capsule; Take 1 capsule (10 mg total) by mouth daily.  Dispense: 90 capsule; Refill: 1  6. Familial multiple lipoprotein-type hyperlipidemia .  Controlled.  Stable.  Continue simvastatin 20 mg once a day.  Will check lipid panel for current control of LDL. - simvastatin (ZOCOR) 20 MG tablet; Take 1 tablet (20 mg total) by mouth daily.  Dispense: 90 tablet; Refill: 1 - Lipid Panel With LDL/HDL Ratio

## 2020-09-20 LAB — RENAL FUNCTION PANEL
Albumin: 4.5 g/dL (ref 3.8–4.8)
BUN/Creatinine Ratio: 17 (ref 12–28)
BUN: 11 mg/dL (ref 8–27)
CO2: 25 mmol/L (ref 20–29)
Calcium: 9.8 mg/dL (ref 8.7–10.3)
Chloride: 100 mmol/L (ref 96–106)
Creatinine, Ser: 0.64 mg/dL (ref 0.57–1.00)
Glucose: 96 mg/dL (ref 65–99)
Phosphorus: 4 mg/dL (ref 3.0–4.3)
Potassium: 3.9 mmol/L (ref 3.5–5.2)
Sodium: 140 mmol/L (ref 134–144)
eGFR: 97 mL/min/{1.73_m2} (ref >59)

## 2020-09-20 LAB — LIPID PANEL WITH LDL/HDL RATIO
Cholesterol, Total: 173 mg/dL (ref 100–199)
HDL: 51 mg/dL (ref >39)
LDL Chol Calc (NIH): 94 mg/dL (ref 0–99)
LDL/HDL Ratio: 1.8 ratio (ref 0.0–3.2)
Triglycerides: 162 mg/dL — ABNORMAL HIGH (ref 0–149)
VLDL Cholesterol Cal: 28 mg/dL (ref 5–40)

## 2020-09-21 DIAGNOSIS — M9902 Segmental and somatic dysfunction of thoracic region: Secondary | ICD-10-CM | POA: Diagnosis not present

## 2020-09-21 DIAGNOSIS — M531 Cervicobrachial syndrome: Secondary | ICD-10-CM | POA: Diagnosis not present

## 2020-09-21 DIAGNOSIS — R200 Anesthesia of skin: Secondary | ICD-10-CM | POA: Diagnosis not present

## 2020-09-21 DIAGNOSIS — M79641 Pain in right hand: Secondary | ICD-10-CM | POA: Diagnosis not present

## 2020-09-21 DIAGNOSIS — M25511 Pain in right shoulder: Secondary | ICD-10-CM | POA: Diagnosis not present

## 2020-09-21 DIAGNOSIS — M62838 Other muscle spasm: Secondary | ICD-10-CM | POA: Diagnosis not present

## 2020-09-21 DIAGNOSIS — M9901 Segmental and somatic dysfunction of cervical region: Secondary | ICD-10-CM | POA: Diagnosis not present

## 2020-09-21 DIAGNOSIS — M5415 Radiculopathy, thoracolumbar region: Secondary | ICD-10-CM | POA: Diagnosis not present

## 2020-09-28 DIAGNOSIS — M25511 Pain in right shoulder: Secondary | ICD-10-CM | POA: Diagnosis not present

## 2020-09-28 DIAGNOSIS — M9901 Segmental and somatic dysfunction of cervical region: Secondary | ICD-10-CM | POA: Diagnosis not present

## 2020-09-28 DIAGNOSIS — M62838 Other muscle spasm: Secondary | ICD-10-CM | POA: Diagnosis not present

## 2020-09-28 DIAGNOSIS — R200 Anesthesia of skin: Secondary | ICD-10-CM | POA: Diagnosis not present

## 2020-09-28 DIAGNOSIS — M531 Cervicobrachial syndrome: Secondary | ICD-10-CM | POA: Diagnosis not present

## 2020-09-28 DIAGNOSIS — M9902 Segmental and somatic dysfunction of thoracic region: Secondary | ICD-10-CM | POA: Diagnosis not present

## 2020-09-28 DIAGNOSIS — M5415 Radiculopathy, thoracolumbar region: Secondary | ICD-10-CM | POA: Diagnosis not present

## 2020-09-28 DIAGNOSIS — M79641 Pain in right hand: Secondary | ICD-10-CM | POA: Diagnosis not present

## 2020-10-05 DIAGNOSIS — M5415 Radiculopathy, thoracolumbar region: Secondary | ICD-10-CM | POA: Diagnosis not present

## 2020-10-05 DIAGNOSIS — R200 Anesthesia of skin: Secondary | ICD-10-CM | POA: Diagnosis not present

## 2020-10-05 DIAGNOSIS — M79641 Pain in right hand: Secondary | ICD-10-CM | POA: Diagnosis not present

## 2020-10-05 DIAGNOSIS — M62838 Other muscle spasm: Secondary | ICD-10-CM | POA: Diagnosis not present

## 2020-10-05 DIAGNOSIS — M25511 Pain in right shoulder: Secondary | ICD-10-CM | POA: Diagnosis not present

## 2020-10-05 DIAGNOSIS — M9902 Segmental and somatic dysfunction of thoracic region: Secondary | ICD-10-CM | POA: Diagnosis not present

## 2020-10-05 DIAGNOSIS — M9901 Segmental and somatic dysfunction of cervical region: Secondary | ICD-10-CM | POA: Diagnosis not present

## 2020-10-05 DIAGNOSIS — M531 Cervicobrachial syndrome: Secondary | ICD-10-CM | POA: Diagnosis not present

## 2020-10-11 DIAGNOSIS — M531 Cervicobrachial syndrome: Secondary | ICD-10-CM | POA: Diagnosis not present

## 2020-10-11 DIAGNOSIS — M9902 Segmental and somatic dysfunction of thoracic region: Secondary | ICD-10-CM | POA: Diagnosis not present

## 2020-10-11 DIAGNOSIS — M25511 Pain in right shoulder: Secondary | ICD-10-CM | POA: Diagnosis not present

## 2020-10-11 DIAGNOSIS — M9901 Segmental and somatic dysfunction of cervical region: Secondary | ICD-10-CM | POA: Diagnosis not present

## 2020-10-11 DIAGNOSIS — M79641 Pain in right hand: Secondary | ICD-10-CM | POA: Diagnosis not present

## 2020-10-11 DIAGNOSIS — M62838 Other muscle spasm: Secondary | ICD-10-CM | POA: Diagnosis not present

## 2020-10-11 DIAGNOSIS — R200 Anesthesia of skin: Secondary | ICD-10-CM | POA: Diagnosis not present

## 2020-10-11 DIAGNOSIS — M5415 Radiculopathy, thoracolumbar region: Secondary | ICD-10-CM | POA: Diagnosis not present

## 2020-10-18 DIAGNOSIS — M5415 Radiculopathy, thoracolumbar region: Secondary | ICD-10-CM | POA: Diagnosis not present

## 2020-10-18 DIAGNOSIS — M79641 Pain in right hand: Secondary | ICD-10-CM | POA: Diagnosis not present

## 2020-10-18 DIAGNOSIS — M25511 Pain in right shoulder: Secondary | ICD-10-CM | POA: Diagnosis not present

## 2020-10-18 DIAGNOSIS — M62838 Other muscle spasm: Secondary | ICD-10-CM | POA: Diagnosis not present

## 2020-10-18 DIAGNOSIS — R200 Anesthesia of skin: Secondary | ICD-10-CM | POA: Diagnosis not present

## 2020-10-18 DIAGNOSIS — M9902 Segmental and somatic dysfunction of thoracic region: Secondary | ICD-10-CM | POA: Diagnosis not present

## 2020-10-18 DIAGNOSIS — M531 Cervicobrachial syndrome: Secondary | ICD-10-CM | POA: Diagnosis not present

## 2020-10-18 DIAGNOSIS — M9901 Segmental and somatic dysfunction of cervical region: Secondary | ICD-10-CM | POA: Diagnosis not present

## 2020-11-01 DIAGNOSIS — M531 Cervicobrachial syndrome: Secondary | ICD-10-CM | POA: Diagnosis not present

## 2020-11-01 DIAGNOSIS — M9902 Segmental and somatic dysfunction of thoracic region: Secondary | ICD-10-CM | POA: Diagnosis not present

## 2020-11-01 DIAGNOSIS — M79641 Pain in right hand: Secondary | ICD-10-CM | POA: Diagnosis not present

## 2020-11-01 DIAGNOSIS — M25511 Pain in right shoulder: Secondary | ICD-10-CM | POA: Diagnosis not present

## 2020-11-01 DIAGNOSIS — M9901 Segmental and somatic dysfunction of cervical region: Secondary | ICD-10-CM | POA: Diagnosis not present

## 2020-11-01 DIAGNOSIS — M5415 Radiculopathy, thoracolumbar region: Secondary | ICD-10-CM | POA: Diagnosis not present

## 2020-11-01 DIAGNOSIS — R200 Anesthesia of skin: Secondary | ICD-10-CM | POA: Diagnosis not present

## 2020-11-01 DIAGNOSIS — M62838 Other muscle spasm: Secondary | ICD-10-CM | POA: Diagnosis not present

## 2020-11-29 DIAGNOSIS — M9902 Segmental and somatic dysfunction of thoracic region: Secondary | ICD-10-CM | POA: Diagnosis not present

## 2020-11-29 DIAGNOSIS — M531 Cervicobrachial syndrome: Secondary | ICD-10-CM | POA: Diagnosis not present

## 2020-11-29 DIAGNOSIS — M5415 Radiculopathy, thoracolumbar region: Secondary | ICD-10-CM | POA: Diagnosis not present

## 2020-11-29 DIAGNOSIS — M62838 Other muscle spasm: Secondary | ICD-10-CM | POA: Diagnosis not present

## 2020-11-29 DIAGNOSIS — M79641 Pain in right hand: Secondary | ICD-10-CM | POA: Diagnosis not present

## 2020-11-29 DIAGNOSIS — M25511 Pain in right shoulder: Secondary | ICD-10-CM | POA: Diagnosis not present

## 2020-11-29 DIAGNOSIS — M9901 Segmental and somatic dysfunction of cervical region: Secondary | ICD-10-CM | POA: Diagnosis not present

## 2020-11-29 DIAGNOSIS — R200 Anesthesia of skin: Secondary | ICD-10-CM | POA: Diagnosis not present

## 2020-12-29 DIAGNOSIS — Z961 Presence of intraocular lens: Secondary | ICD-10-CM | POA: Diagnosis not present

## 2020-12-29 DIAGNOSIS — Z0100 Encounter for examination of eyes and vision without abnormal findings: Secondary | ICD-10-CM | POA: Diagnosis not present

## 2021-01-23 ENCOUNTER — Other Ambulatory Visit: Payer: Self-pay | Admitting: Family Medicine

## 2021-01-23 DIAGNOSIS — M1990 Unspecified osteoarthritis, unspecified site: Secondary | ICD-10-CM

## 2021-01-23 DIAGNOSIS — E7849 Other hyperlipidemia: Secondary | ICD-10-CM

## 2021-01-24 NOTE — Telephone Encounter (Signed)
Hemoglobin was 13.2 on 06/07/2020 - normal result  Requested Prescriptions  Pending Prescriptions Disp Refills   simvastatin (ZOCOR) 20 MG tablet [Pharmacy Med Name: SIMVASTATIN 20 MG TABLET] 90 tablet 1    Sig: TAKE 1 TABLET BY MOUTH EVERY DAY     Cardiovascular:  Antilipid - Statins Failed - 01/23/2021 10:30 AM      Failed - Triglycerides in normal range and within 360 days    Triglycerides  Date Value Ref Range Status  09/19/2020 162 (H) 0 - 149 mg/dL Final  03/11/2013 91 0 - 200 mg/dL Final         Passed - Total Cholesterol in normal range and within 360 days    Cholesterol, Total  Date Value Ref Range Status  09/19/2020 173 100 - 199 mg/dL Final   Cholesterol  Date Value Ref Range Status  03/11/2013 95 0 - 200 mg/dL Final         Passed - LDL in normal range and within 360 days    Ldl Cholesterol, Calc  Date Value Ref Range Status  03/11/2013 48 0 - 100 mg/dL Final   LDL Chol Calc (NIH)  Date Value Ref Range Status  09/19/2020 94 0 - 99 mg/dL Final         Passed - HDL in normal range and within 360 days    HDL Cholesterol  Date Value Ref Range Status  03/11/2013 29 (L) 40 - 60 mg/dL Final   HDL  Date Value Ref Range Status  09/19/2020 51 >39 mg/dL Final         Passed - Patient is not pregnant      Passed - Valid encounter within last 12 months    Recent Outpatient Visits          4 months ago Essential hypertension   [facility] Juline Patch, MD   10 months ago Taking medication for chronic disease   [facility] Juline Patch, MD   1 year ago Acute non-recurrent sinusitis of other sinus   [facility] Juline Patch, MD   1 year ago Restless leg syndrome   [facility] Juline Patch, MD   1 year ago Essential hypertension   [facility] Juline Patch, MD      Future Appointments            In 1 month Juline Patch, MD [facility]            meloxicam (MOBIC) 15 MG  tablet [Pharmacy Med Name: MELOXICAM 15 MG TABLET] 90 tablet 1    Sig: TAKE 1 TABLET (15 MG TOTAL) BY MOUTH DAILY.     Analgesics:  COX2 Inhibitors Failed - 01/23/2021 10:30 AM      Failed - HGB in normal range and within 360 days    HGB  Date Value Ref Range Status  03/12/2013 8.2 (L) 12.0 - 16.0 g/dL Final         Passed - Cr in normal range and within 360 days    Creatinine  Date Value Ref Range Status  03/11/2013 0.68 0.60 - 1.30 mg/dL Final   Creatinine, Ser  Date Value Ref Range Status  09/19/2020 0.64 0.57 - 1.00 mg/dL Final         Passed - Patient is not pregnant      Passed - Valid encounter within last 12 months    Recent Outpatient Visits  4 months ago Essential hypertension   [facility] Juline Patch, MD   10 months ago Taking medication for chronic disease   [facility] Juline Patch, MD   1 year ago Acute non-recurrent sinusitis of other sinus   [facility] Juline Patch, MD   1 year ago Restless leg syndrome   [facility] Juline Patch, MD   1 year ago Essential hypertension   [place], [place]      Future Appointments            In 1 month Juline Patch, MD [facility]

## 2021-02-09 DIAGNOSIS — N951 Menopausal and female climacteric states: Secondary | ICD-10-CM | POA: Diagnosis not present

## 2021-02-09 DIAGNOSIS — N6321 Unspecified lump in the left breast, upper outer quadrant: Secondary | ICD-10-CM | POA: Diagnosis not present

## 2021-02-09 DIAGNOSIS — Z01419 Encounter for gynecological examination (general) (routine) without abnormal findings: Secondary | ICD-10-CM | POA: Diagnosis not present

## 2021-02-13 ENCOUNTER — Other Ambulatory Visit: Payer: Self-pay | Admitting: Certified Nurse Midwife

## 2021-02-13 DIAGNOSIS — N6321 Unspecified lump in the left breast, upper outer quadrant: Secondary | ICD-10-CM

## 2021-02-20 ENCOUNTER — Ambulatory Visit
Admission: RE | Admit: 2021-02-20 | Discharge: 2021-02-20 | Disposition: A | Discharge status: Home / Self Care | Payer: Medicare PPO | Source: Ambulatory Visit | Attending: Certified Nurse Midwife | Admitting: Certified Nurse Midwife

## 2021-02-20 ENCOUNTER — Other Ambulatory Visit: Payer: Self-pay

## 2021-02-20 DIAGNOSIS — R922 Inconclusive mammogram: Secondary | ICD-10-CM | POA: Diagnosis not present

## 2021-02-20 DIAGNOSIS — N6321 Unspecified lump in the left breast, upper outer quadrant: Secondary | ICD-10-CM | POA: Insufficient documentation

## 2021-03-21 ENCOUNTER — Ambulatory Visit: Payer: Medicare PPO | Admitting: Family Medicine

## 2021-03-21 ENCOUNTER — Other Ambulatory Visit: Payer: Self-pay

## 2021-03-21 ENCOUNTER — Encounter: Payer: Self-pay | Admitting: Family Medicine

## 2021-03-21 VITALS — BP 122/62 | HR 64 | Ht <= 58 in | Wt 138.0 lb

## 2021-03-21 DIAGNOSIS — I10 Essential (primary) hypertension: Secondary | ICD-10-CM

## 2021-03-21 DIAGNOSIS — J4520 Mild intermittent asthma, uncomplicated: Secondary | ICD-10-CM | POA: Diagnosis not present

## 2021-03-21 DIAGNOSIS — E7849 Other hyperlipidemia: Secondary | ICD-10-CM | POA: Diagnosis not present

## 2021-03-21 DIAGNOSIS — K219 Gastro-esophageal reflux disease without esophagitis: Secondary | ICD-10-CM

## 2021-03-21 DIAGNOSIS — J301 Allergic rhinitis due to pollen: Secondary | ICD-10-CM | POA: Diagnosis not present

## 2021-03-21 MED ORDER — OMEPRAZOLE 10 MG PO CPDR: 10.0000 mg | DELAYED_RELEASE_CAPSULE | Freq: Every day | ORAL | 1 refills | Status: DC

## 2021-03-21 MED ORDER — FEXOFENADINE HCL 180 MG PO TABS: 180.0000 mg | ORAL_TABLET | Freq: Every day | ORAL | 1 refills | Status: DC

## 2021-03-21 MED ORDER — SIMVASTATIN 20 MG PO TABS: 20.0000 mg | ORAL_TABLET | Freq: Every day | ORAL | 1 refills | Status: DC

## 2021-03-21 MED ORDER — MONTELUKAST SODIUM 10 MG PO TABS: ORAL_TABLET | ORAL | 1 refills | Status: DC

## 2021-03-21 MED ORDER — LOSARTAN POTASSIUM-HCTZ 100-25 MG PO TABS: 1.0000 | ORAL_TABLET | Freq: Every day | ORAL | 1 refills | Status: DC

## 2021-03-21 MED ORDER — FLUTICASONE PROPIONATE 50 MCG/ACT NA SUSP: NASAL | 1 refills | Status: DC

## 2021-03-21 NOTE — Progress Notes (Signed)
Date:  03/21/2021   Name:  Kristin Mata   DOB:  December 29, 1950   MRN:  [id number]   Chief Complaint: Hypertension, Hyperlipidemia, Gastroesophageal Reflux, Asthma, and Allergic Rhinitis   Hypertension This is a chronic problem. The current episode started more than 1 year ago. The problem has been gradually improving since onset. The problem is controlled. Pertinent negatives include no chest pain, headaches, malaise/fatigue, neck pain, orthopnea, palpitations, PND or shortness of breath. Risk factors for coronary artery disease include dyslipidemia. Past treatments include angiotensin blockers and diuretics. The current treatment provides moderate improvement. There are no compliance problems.  There is no history of angina, kidney disease, CAD/MI, CVA, heart failure, left ventricular hypertrophy, PVD or retinopathy. There is no history of chronic renal disease, a hypertension causing med or renovascular disease.  Hyperlipidemia This is a chronic problem. The current episode started more than 1 year ago. The problem is controlled. Recent lipid tests were reviewed and are normal. She has no history of chronic renal disease, diabetes, hypothyroidism, liver disease, obesity or nephrotic syndrome. Factors aggravating her hyperlipidemia include thiazides. Pertinent negatives include no chest pain, focal sensory loss, focal weakness, leg pain, myalgias or shortness of breath. Current antihyperlipidemic treatment includes statins. The current treatment provides moderate improvement of lipids. There are no compliance problems.  Risk factors for coronary artery disease include dyslipidemia and hypertension.  Gastroesophageal Reflux She reports no abdominal pain, no chest pain, no coughing, no heartburn, no hoarse voice, no nausea, no sore throat or no wheezing. This is a chronic problem. The problem has been gradually improving. The symptoms are aggravated by certain foods. Pertinent negatives include no  weight loss. She has tried a PPI for the symptoms. The treatment provided moderate relief.  Asthma There is no chest tightness, cough, difficulty breathing, frequent throat clearing, hemoptysis, hoarse voice, shortness of breath, sputum production or wheezing. This is a chronic problem. The current episode started more than 1 year ago. The problem occurs intermittently. The problem has been gradually improving. Pertinent negatives include no appetite change, chest pain, dyspnea on exertion, ear pain, fever, headaches, heartburn, malaise/fatigue, myalgias, PND, rhinorrhea, sneezing, sore throat or weight loss. Her past medical history is significant for asthma.   Lab Results  Component Value Date   NA 140 09/19/2020   K 3.9 09/19/2020   CO2 25 09/19/2020   GLUCOSE 96 09/19/2020   BUN 11 09/19/2020   CREATININE 0.64 09/19/2020   CALCIUM 9.8 09/19/2020   EGFR 97 09/19/2020   GFRNONAA 97 09/30/2019   Lab Results  Component Value Date   CHOL 173 09/19/2020   HDL 51 09/19/2020   LDLCALC 94 09/19/2020   TRIG 162 (H) 09/19/2020   CHOLHDL 3.1 08/15/2017   No results found for: TSH No results found for: HGBA1C Lab Results  Component Value Date   WBC 8.4 03/11/2013   HGB 8.2 (L) 03/12/2013   HCT 21.5 (L) 03/11/2013   MCV 85 03/11/2013   PLT 395 03/11/2013   Lab Results  Component Value Date   ALT 27 03/30/2020   AST 26 03/30/2020   ALKPHOS 60 03/30/2020   BILITOT 0.3 03/30/2020   No results found for: 25OHVITD2, 25OHVITD3, VD25OH   Review of Systems  Constitutional:  Negative for appetite change, chills, fever, malaise/fatigue and weight loss.  HENT:  Negative for drooling, ear discharge, ear pain, hoarse voice, rhinorrhea, sneezing and sore throat.   Respiratory:  Negative for cough, hemoptysis, sputum production, shortness of breath and  wheezing.   Cardiovascular:  Negative for chest pain, dyspnea on exertion, palpitations, orthopnea, leg swelling and PND.  Gastrointestinal:   Negative for abdominal pain, blood in stool, constipation, diarrhea, heartburn and nausea.  Endocrine: Negative for polydipsia.  Genitourinary:  Negative for dysuria, frequency, hematuria and urgency.  Musculoskeletal:  Negative for back pain, myalgias and neck pain.  Skin:  Negative for rash.  Allergic/Immunologic: Negative for environmental allergies.  Neurological:  Negative for dizziness, focal weakness and headaches.  Hematological:  Does not bruise/bleed easily.  Psychiatric/Behavioral:  Negative for suicidal ideas. The patient is not nervous/anxious.    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   GERD (gastroesophageal reflux disease) 04/09/2013   GBS (Guillain Barre syndrome) ([place]) 04/09/2013   DDD (degenerative disc disease), lumbar 04/09/2013   Colon cancer ([place]) 04/09/2013   Anemia 04/09/2013   Arthritis 03/13/2013    No Known Allergies  Past Surgical History:  Procedure Laterality Date   ABDOMINAL HYSTERECTOMY     BREAST CYST ASPIRATION Bilateral    neg   CATARACT EXTRACTION W/PHACO Right 10/18/2019   Procedure: CATARACT EXTRACTION PHACO AND INTRAOCULAR LENS PLACEMENT ([place]) RIGHT 2.35  00:26.5;  Surgeon: Eulogio Bear, MD;  Location: [facility];  Service: Ophthalmology;  Laterality: Right;   CATARACT EXTRACTION W/PHACO Left 11/15/2019   Procedure: CATARACT EXTRACTION PHACO AND INTRAOCULAR LENS PLACEMENT ([facility]) LEFT;  Surgeon: Eulogio Bear, MD;  Location: [facility];  Service: Ophthalmology;  Laterality: Left;  1.50 0:19.1   COLON RESECTION     HAMMER TOE SURGERY     HAND SURGERY     HAND SURGERY     Thumb   ROTATOR CUFF REPAIR Right     Social History   Tobacco Use   Smoking status: Never   Smokeless tobacco: Never  Vaping Use   Vaping Use: Never used  Substance Use Topics    Alcohol use: Yes    Alcohol/week: 1.0 standard drink    Types: 1 Standard drinks or equivalent per week   Drug use: No     Medication list has been reviewed and updated.  Current Meds  Medication Sig   albuterol (PROVENTIL HFA;VENTOLIN HFA) 108 (90 Base) MCG/ACT inhaler [facility]   ascorbic acid (VITAMIN C) 250 MG tablet Take 1 tablet by mouth daily.   aspirin EC 81 MG tablet Take 81 mg by mouth daily.   Biotin 10 MG TABS Take 1 tablet by mouth daily.   CANNABIDIOL PO Take 3,000 mg by mouth daily.   Cholecalciferol 25 MCG (1000 UT) tablet Take 1 tablet by mouth daily.   ferrous sulfate 325 (65 FE) MG tablet Take 325 mg by mouth daily with breakfast.   fexofenadine (ALLEGRA) 180 MG tablet Take 1 tablet (180 mg total) by mouth daily.   fluticasone (FLONASE) 50 MCG/ACT nasal spray One puff each nostril q day   fluticasone-salmeterol (ADVAIR) 100-50 MCG/ACT AEPB Inhale into the lungs.   gabapentin (NEURONTIN) 100 MG capsule TAKE 1 CAPSULE BY MOUTH THREE TIMES A DAY (Patient taking differently: 1 in am and 300 in pm- Shah)   losartan-hydrochlorothiazide (HYZAAR) 100-25 MG tablet Take 1 tablet by mouth daily.   magnesium 30 MG tablet Take 30 mg by mouth daily.   magnesium oxide (MAG-OX) 400 MG tablet Take 400 mg by mouth daily.   meloxicam (MOBIC) 15 MG tablet TAKE 1 TABLET (  15 MG TOTAL) BY MOUTH DAILY.   montelukast (SINGULAIR) 10 MG tablet Take 1 tablet by mouth daily   Multiple Vitamin (MULTIVITAMIN) tablet Take 1 tablet by mouth daily.   Omega-3 Fatty Acids (FISH OIL) 1000 MG CAPS Take 1 capsule by mouth 2 (two) times daily.   omeprazole (PRILOSEC) 10 MG capsule Take 1 capsule (10 mg total) by mouth daily.   simvastatin (ZOCOR) 20 MG tablet TAKE 1 TABLET BY MOUTH EVERY DAY   vitamin E 1000 UNIT capsule Take 1 capsule by mouth daily.    PHQ 2/9 Scores 03/21/2021 06/07/2020 03/30/2020 01/17/2020  PHQ - 2 Score 0 0 0 0  PHQ- 9 Score 0 - 0 0    GAD 7 : Generalized Anxiety Score  03/21/2021 03/30/2020  Nervous, Anxious, on Edge 0 0  Control/stop worrying 0 0  Worry too much - different things 0 0  Trouble relaxing 0 0  Restless 0 0  Easily annoyed or irritable 0 0  Afraid - awful might happen 0 0  Total GAD 7 Score 0 0  Anxiety Difficulty Not difficult at all -    BP Readings from Last 3 Encounters:  03/21/21 122/62  09/19/20 130/80  06/07/20 138/72    Physical Exam  Wt Readings from Last 3 Encounters:  03/21/21 138 lb (62.6 kg)  09/19/20 140 lb (63.5 kg)  06/07/20 140 lb 12.8 oz (63.9 kg)    BP 122/62    Pulse 64    Ht _0  (1.473 m)    Wt 138 lb (62.6 kg)    BMI 28.84 kg/m   Assessment and Plan:  1. Essential hypertension Chronic.  Controlled.  Stable.  Blood pressure today is 122/62.  Continue losartan hydrochlorothiazide 100-25 mg once a day.  Will check CMP for electrolytes and GFR. - losartan-hydrochlorothiazide (HYZAAR) 100-25 MG tablet; Take 1 tablet by mouth daily.  Dispense: 90 tablet; Refill: 1 - Comprehensive Metabolic Panel (CMET)  2. Seasonal allergic rhinitis due to pollen Chronic.  Controlled.  Stable.  Patient is currently using combination of fexofenadine 180 mg once a day, Flonase nasal spray 1 puff each nostril daily and Singulair 10 mg once a day.  Patient will continue to do so given the moderate to high pollen counts today and even higher tomorrow. - fexofenadine (ALLEGRA) 180 MG tablet; Take 1 tablet (180 mg total) by mouth daily.  Dispense: 90 tablet; Refill: 1 - fluticasone (FLONASE) 50 MCG/ACT nasal spray; One puff each nostril q day  Dispense: 48 mL; Refill: 1 - montelukast (SINGULAIR) 10 MG tablet; Take 1 tablet by mouth daily  Dispense: 90 tablet; Refill: 1  3. Mild intermittent asthma without complication Chronic.  Controlled.  Stable.  In addition to the Singulair patient is using a long-acting beta agonist and rescue short acting beta agonist.  I have encouraged her to go ahead and use the short acting beta agonist on  a regular basis given the pollen count. - montelukast (SINGULAIR) 10 MG tablet; Take 1 tablet by mouth daily  Dispense: 90 tablet; Refill: 1  4. Gastroesophageal reflux disease without esophagitis Chronic.  Controlled.  Stable.  Continue omeprazole 10 mg once a day. - omeprazole (PRILOSEC) 10 MG capsule; Take 1 capsule (10 mg total) by mouth daily.  Dispense: 90 capsule; Refill: 1  5. Familial multiple lipoprotein-type hyperlipidemia Chronic.  Controlled.  Stable.  Continue simvastatin 20 mg once a day.  We will check lipid panel. - simvastatin (ZOCOR) 20 MG tablet; Take 1 tablet (  20 mg total) by mouth daily.  Dispense: 90 tablet; Refill: 1 - Lipid Panel With LDL/HDL Ratio

## 2021-03-22 LAB — COMPREHENSIVE METABOLIC PANEL
ALT: 20 IU/L (ref 0–32)
AST: 23 IU/L (ref 0–40)
Albumin/Globulin Ratio: 1.9 (ref 1.2–2.2)
Albumin: 4.5 g/dL (ref 3.8–4.8)
Alkaline Phosphatase: 59 IU/L (ref 44–121)
BUN/Creatinine Ratio: 18 (ref 12–28)
BUN: 12 mg/dL (ref 8–27)
Bilirubin Total: 0.4 mg/dL (ref 0.0–1.2)
CO2: 25 mmol/L (ref 20–29)
Calcium: 9.6 mg/dL (ref 8.7–10.3)
Chloride: 102 mmol/L (ref 96–106)
Creatinine, Ser: 0.67 mg/dL (ref 0.57–1.00)
Globulin, Total: 2.4 g/dL (ref 1.5–4.5)
Glucose: 99 mg/dL (ref 70–99)
Potassium: 4.2 mmol/L (ref 3.5–5.2)
Sodium: 141 mmol/L (ref 134–144)
Total Protein: 6.9 g/dL (ref 6.0–8.5)
eGFR: 95 mL/min/{1.73_m2} (ref >59)

## 2021-03-22 LAB — LIPID PANEL WITH LDL/HDL RATIO
Cholesterol, Total: 165 mg/dL (ref 100–199)
HDL: 58 mg/dL (ref >39)
LDL Chol Calc (NIH): 89 mg/dL (ref 0–99)
LDL/HDL Ratio: 1.5 ratio (ref 0.0–3.2)
Triglycerides: 102 mg/dL (ref 0–149)
VLDL Cholesterol Cal: 18 mg/dL (ref 5–40)

## 2021-06-11 ENCOUNTER — Ambulatory Visit (INDEPENDENT_AMBULATORY_CARE_PROVIDER_SITE_OTHER): Payer: Medicare PPO

## 2021-06-11 DIAGNOSIS — Z0000 Encounter for general adult medical examination without abnormal findings: Secondary | ICD-10-CM | POA: Diagnosis not present

## 2021-06-11 NOTE — Patient Instructions (Signed)
Kristin Mata , ?Thank you for taking time to come for your Medicare Wellness Visit. I appreciate your ongoing commitment to your health goals. Please review the following plan we discussed and let me know if I can assist you in the future.  ? ?Screening recommendations/referrals: ?Colonoscopy: done 06/09/17. Pt to discuss repeat screening at next office visit.  ?Mammogram: done 02/20/21 ?Bone Density: done 07/20/20 ?Recommended yearly ophthalmology/optometry visit for glaucoma screening and checkup ?Recommended yearly dental visit for hygiene and checkup ? ?Vaccinations: ?Influenza vaccine: n/a ?Pneumococcal vaccine: done 06/07/20 ?Tdap vaccine: done 01/05/16 ?Shingles vaccine: done 11/19/18 & 01/26/19   ?Covid-19:done 01/20/19, 02/10/19, 11/05/19 & 08/25/20 ? ?Advanced directives: Please bring a copy of your health care power of attorney and living will to the office at your convenience.  ? ?Conditions/risks identified: Keep up the great work! ? ?Next appointment: Follow up in one year for your annual wellness visit  ? ? ?Preventive Care 69 Years and Older, Female ?Preventive care refers to lifestyle choices and visits with your health care provider that can promote health and wellness. ?What does preventive care include? ?A yearly physical exam. This is also called an annual well check. ?Dental exams once or twice a year. ?Routine eye exams. Ask your health care provider how often you should have your eyes checked. ?Personal lifestyle choices, including: ?Daily care of your teeth and gums. ?Regular physical activity. ?Eating a healthy diet. ?Avoiding tobacco and drug use. ?Limiting alcohol use. ?Practicing safe sex. ?Taking low-dose aspirin every day. ?Taking vitamin and mineral supplements as recommended by your health care provider. ?What happens during an annual well check? ?The services and screenings done by your health care provider during your annual well check will depend on your age, overall health, lifestyle risk  factors, and family history of disease. ?Counseling  ?Your health care provider may ask you questions about your: ?Alcohol use. ?Tobacco use. ?Drug use. ?Emotional well-being. ?Home and relationship well-being. ?Sexual activity. ?Eating habits. ?History of falls. ?Memory and ability to understand (cognition). ?Work and work Statistician. ?Reproductive health. ?Screening  ?You may have the following tests or measurements: ?Height, weight, and BMI. ?Blood pressure. ?Lipid and cholesterol levels. These may be checked every 5 years, or more frequently if you are over 1 years old. ?Skin check. ?Lung cancer screening. You may have this screening every year starting at age 38 if you have a 30-pack-year history of smoking and currently smoke or have quit within the past 15 years. ?Fecal occult blood test (FOBT) of the stool. You may have this test every year starting at age 55. ?Flexible sigmoidoscopy or colonoscopy. You may have a sigmoidoscopy every 5 years or a colonoscopy every 10 years starting at age 11. ?Hepatitis C blood test. ?Hepatitis B blood test. ?Sexually transmitted disease (STD) testing. ?Diabetes screening. This is done by checking your blood sugar (glucose) after you have not eaten for a while (fasting). You may have this done every 1-3 years. ?Bone density scan. This is done to screen for osteoporosis. You may have this done starting at age 27. ?Mammogram. This may be done every 1-2 years. Talk to your health care provider about how often you should have regular mammograms. ?Talk with your health care provider about your test results, treatment options, and if necessary, the need for more tests. ?Vaccines  ?Your health care provider may recommend certain vaccines, such as: ?Influenza vaccine. This is recommended every year. ?Tetanus, diphtheria, and acellular pertussis (Tdap, Td) vaccine. You may need a Td booster  every 10 years. ?Zoster vaccine. You may need this after age 51. ?Pneumococcal 13-valent  conjugate (PCV13) vaccine. One dose is recommended after age 39. ?Pneumococcal polysaccharide (PPSV23) vaccine. One dose is recommended after age 18. ?Talk to your health care provider about which screenings and vaccines you need and how often you need them. ?This information is not intended to replace advice given to you by your health care provider. Make sure you discuss any questions you have with your health care provider. ?Document Released: 02/10/2015 Document Revised: 10/04/2015 Document Reviewed: 11/15/2014 ?Elsevier Interactive Patient Education ? 2017 [place]. ? ?Fall Prevention in the Home ?Falls can cause injuries. They can happen to people of all ages. There are many things you can do to make your home safe and to help prevent falls. ?What can I do on the outside of my home? ?Regularly fix the edges of walkways and driveways and fix any cracks. ?Remove anything that might make you trip as you walk through a door, such as a raised step or threshold. ?Trim any bushes or trees on the path to your home. ?Use bright outdoor lighting. ?Clear any walking paths of anything that might make someone trip, such as rocks or tools. ?Regularly check to see if handrails are loose or broken. Make sure that both sides of any steps have handrails. ?Any raised decks and porches should have guardrails on the edges. ?Have any leaves, snow, or ice cleared regularly. ?Use sand or salt on walking paths during winter. ?Clean up any spills in your garage right away. This includes oil or grease spills. ?What can I do in the bathroom? ?Use night lights. ?Install grab bars by the toilet and in the tub and shower. Do not use towel bars as grab bars. ?Use non-skid mats or decals in the tub or shower. ?If you need to sit down in the shower, use a plastic, non-slip stool. ?Keep the floor dry. Clean up any water that spills on the floor as soon as it happens. ?Remove soap buildup in the tub or shower regularly. ?Attach bath mats  securely with double-sided non-slip rug tape. ?Do not have throw rugs and other things on the floor that can make you trip. ?What can I do in the bedroom? ?Use night lights. ?Make sure that you have a light by your bed that is easy to reach. ?Do not use any sheets or blankets that are too big for your bed. They should not hang down onto the floor. ?Have a firm chair that has side arms. You can use this for support while you get dressed. ?Do not have throw rugs and other things on the floor that can make you trip. ?What can I do in the kitchen? ?Clean up any spills right away. ?Avoid walking on wet floors. ?Keep items that you use a lot in easy-to-reach places. ?If you need to reach something above you, use a strong step stool that has a grab bar. ?Keep electrical cords out of the way. ?Do not use floor polish or wax that makes floors slippery. If you must use wax, use non-skid floor wax. ?Do not have throw rugs and other things on the floor that can make you trip. ?What can I do with my stairs? ?Do not leave any items on the stairs. ?Make sure that there are handrails on both sides of the stairs and use them. Fix handrails that are broken or loose. Make sure that handrails are as long as the stairways. ?Check any carpeting to  make sure that it is firmly attached to the stairs. Fix any carpet that is loose or worn. ?Avoid having throw rugs at the top or bottom of the stairs. If you do have throw rugs, attach them to the floor with carpet tape. ?Make sure that you have a light switch at the top of the stairs and the bottom of the stairs. If you do not have them, ask someone to add them for you. ?What else can I do to help prevent falls? ?Wear shoes that: ?Do not have high heels. ?Have rubber bottoms. ?Are comfortable and fit you well. ?Are closed at the toe. Do not wear sandals. ?If you use a stepladder: ?Make sure that it is fully opened. Do not climb a closed stepladder. ?Make sure that both sides of the stepladder  are locked into place. ?Ask someone to hold it for you, if possible. ?Clearly mark and make sure that you can see: ?Any grab bars or handrails. ?First and last steps. ?Where the edge of each step is. ?Use to

## 2021-06-11 NOTE — Progress Notes (Signed)
? ?Subjective:  ? Kristin Mata is a 71 y.o. female who presents for Medicare Annual (Subsequent) preventive examination. ? ?Virtual Visit via Telephone Note ? ?I connected with  Kristin Mata on 06/11/21 at 11:00 AM EDT by telephone and verified that I am speaking with the correct person using two identifiers. ? ?Location: ?Patient: home ?Provider: [facility] ?Persons participating in the virtual visit: patient/Nurse Health Advisor ?  ?I discussed the limitations, risks, security and privacy concerns of performing an evaluation and management service by telephone and the availability of in person appointments. The patient expressed understanding and agreed to proceed. ? ?Interactive audio and video telecommunications were attempted between this nurse and patient, however failed, due to patient having technical difficulties OR patient did not have access to video capability.  We continued and completed visit with audio only. ? ?Some vital signs may be absent or patient reported.  ? ?Clemetine Marker, LPN ? ? ?Review of Systems    ? ?Cardiac Risk Factors include: advanced age (>28mn, >>76women);hypertension;dyslipidemia ? ?   ?Objective:  ?  ?There were no vitals filed for this visit. ?There is no height or weight on file to calculate BMI. ? ? ?  06/11/2021  ? 11:04 AM 06/07/2020  ? 10:22 AM 11/15/2019  ?  7:41 AM 10/18/2019  ? 11:43 AM  ?Advanced Directives  ?Does Patient Have a Medical Advance Directive? Yes Yes Yes Yes  ?Type of Advance Directive Living will;Healthcare Power of [place] Living will;Healthcare Power of Attorney Living will  ?Does patient want to make changes to medical advance directive?   No - Patient declined No - Patient declined  ?[place] of [place]? No - copy requested No - copy requested Yes - validated most recent copy scanned in chart (See row information)   ?Would patient like information on creating a medical advance  directive?   No - Patient declined   ? ? ?Current Medications (verified) ?Outpatient Encounter Medications as of 06/11/2021  ?Medication Sig  ? albuterol (PROVENTIL HFA;VENTOLIN HFA) 108 (90 Base) MCG/ACT inhaler FRaul Del ? ascorbic acid (VITAMIN C) 250 MG tablet Take 1 tablet by mouth daily.  ? aspirin EC 81 MG tablet Take 81 mg by mouth daily.  ? Biotin 10 MG TABS Take 1 tablet by mouth daily.  ? Cholecalciferol 25 MCG (1000 UT) tablet Take 1 tablet by mouth daily.  ? ferrous sulfate 325 (65 FE) MG tablet Take 325 mg by mouth daily with breakfast.  ? fexofenadine (ALLEGRA) 180 MG tablet Take 1 tablet (180 mg total) by mouth daily.  ? fluticasone (FLONASE) 50 MCG/ACT nasal spray One puff each nostril q day  ? fluticasone-salmeterol (ADVAIR) 100-50 MCG/ACT AEPB Inhale into the lungs. Inhale 1 inhalation into the lungs every 12 (twelve) hours PATIENT NEEDS APPOINTMENT FOR ANY ADDITIONAL REFILLS  ? gabapentin (NEURONTIN) 300 MG capsule Take 1 capsule by mouth 2 (two) times daily. Pt taking 1 capsules QHS  ? losartan-hydrochlorothiazide (HYZAAR) 100-25 MG tablet Take 1 tablet by mouth daily.  ? magnesium 30 MG tablet Take 30 mg by mouth daily.  ? magnesium oxide (MAG-OX) 400 MG tablet Take 400 mg by mouth daily.  ? meloxicam (MOBIC) 15 MG tablet TAKE 1 TABLET (15 MG TOTAL) BY MOUTH DAILY.  ? montelukast (SINGULAIR) 10 MG tablet Take 1 tablet by mouth daily  ? Multiple Vitamin (MULTIVITAMIN) tablet Take 1 tablet by mouth daily.  ? Omega-3 Fatty Acids (FISH OIL)  1000 MG CAPS Take 1 capsule by mouth 2 (two) times daily.  ? omeprazole (PRILOSEC) 10 MG capsule Take 1 capsule (10 mg total) by mouth daily.  ? simvastatin (ZOCOR) 20 MG tablet Take 1 tablet (20 mg total) by mouth daily.  ? vitamin E 1000 UNIT capsule Take 1 capsule by mouth daily.  ? [DISCONTINUED] CANNABIDIOL PO Take 3,000 mg by mouth daily.  ? [DISCONTINUED] fluticasone-salmeterol (ADVAIR) 100-50 MCG/ACT AEPB Inhale into the lungs.  ? [DISCONTINUED] gabapentin  (NEURONTIN) 100 MG capsule TAKE 1 CAPSULE BY MOUTH THREE TIMES A DAY (Patient taking differently: 1 in am and 300 in pmManuella Ghazi)  ? ?No facility-administered encounter medications on file as of 06/11/2021.  ? ? ?Allergies (verified) ?Patient has no known allergies.  ? ?History: ?Past Medical History:  ?Diagnosis Date  ? Arthritis   ? Asthma   ? Colon cancer ([place]) 2015  ? had chemo  ? Degenerative disc disease, lumbar   ? Hyperlipidemia   ? Hypertension   ? Neuropathy   ? hands and feet.  S/P chemo.  ? Personal history of chemotherapy   ? RLS (restless legs syndrome)   ? ?Past Surgical History:  ?Procedure Laterality Date  ? ABDOMINAL HYSTERECTOMY    ? BREAST CYST ASPIRATION Bilateral   ? neg  ? CATARACT EXTRACTION W/PHACO Right 10/18/2019  ? Procedure: CATARACT EXTRACTION PHACO AND INTRAOCULAR LENS PLACEMENT ([facility]) RIGHT 2.35  00:26.5;  Surgeon: Eulogio Bear, MD;  Location: [place];  Service: Ophthalmology;  Laterality: Right;  ? CATARACT EXTRACTION W/PHACO Left 11/15/2019  ? Procedure: CATARACT EXTRACTION PHACO AND INTRAOCULAR LENS PLACEMENT ([place]) LEFT;  Surgeon: Eulogio Bear, MD;  Location: [place];  Service: Ophthalmology;  Laterality: Left;  1.50 ?0:19.1  ? COLON RESECTION    ? HAMMER TOE SURGERY    ? HAND SURGERY    ? HAND SURGERY    ? Thumb  ? ROTATOR CUFF REPAIR Right   ? ?Family History  ?Problem Relation Age of Onset  ? Breast cancer Sister 69  ? ?Social History  ? ?Socioeconomic History  ? Marital status: Married  ?  Spouse name: Not on file  ? Number of children: 4  ? Years of education: Not on file  ? Highest education level: Associate degree: academic program  ?Occupational History  ? Not on file  ?Tobacco Use  ? Smoking status: Never  ? Smokeless tobacco: Never  ?Vaping Use  ? Vaping Use: Never used  ?Substance and Sexual Activity  ? Alcohol use: Yes  ?  Alcohol/week: 1.0 standard drink  ?  Types: 1 Standard drinks or equivalent per week  ? Drug use: No  ? Sexual  activity: Not Currently  ?Other Topics Concern  ? Not on file  ?Social History Narrative  ? Not on file  ? ?Social Determinants of Health  ? ?Financial Resource Strain: Low Risk   ? Difficulty of Paying Living Expenses: Not hard at all  ?Food Insecurity: No Food Insecurity  ? Worried About Charity fundraiser in the Last Year: Never true  ? Ran Out of Food in the Last Year: Never true  ?Transportation Needs: No Transportation Needs  ? Lack of Transportation (Medical): No  ? Lack of Transportation (Non-Medical): No  ?Physical Activity: Inactive  ? Days of Exercise per Week: 0 days  ? Minutes of Exercise per Session: 0 min  ?Stress: No Stress Concern Present  ? Feeling of Stress : Only a little  ?Social  Connections: Moderately Integrated  ? Frequency of Communication with Friends and Family: More than three times a week  ? Frequency of Social Gatherings with Friends and Family: More than three times a week  ? Attends Religious Services: More than 4 times per year  ? Active Member of Clubs or Organizations: No  ? Attends Archivist Meetings: Never  ? Marital Status: Married  ? ? ?Tobacco Counseling ?Counseling given: Not Answered ? ? ?Clinical Intake: ? ?Pre-visit preparation completed: Yes ? ?Pain : No/denies pain ? ?  ? ?Nutritional Risks: None ?Diabetes: No ? ?How often do you need to have someone help you when you read instructions, pamphlets, or other written materials from your doctor or pharmacy?: 1 - Never ? ? ?Interpreter Needed?: No ? ?Information entered by :: Clemetine Marker LPN ? ? ?Activities of Daily Living ? ?  06/11/2021  ? 11:04 AM  ?In your present state of health, do you have any difficulty performing the following activities:  ?Hearing? 0  ?Vision? 0  ?Difficulty concentrating or making decisions? 0  ?Walking or climbing stairs? 0  ?Dressing or bathing? 0  ?Doing errands, shopping? 0  ?Preparing Food and eating ? N  ?Using the Toilet? N  ?In the past six months, have you accidently leaked  urine? N  ?Do you have problems with loss of bowel control? N  ?Managing your Medications? N  ?Managing your Finances? N  ?Housekeeping or managing your Housekeeping? N  ? ? ?Patient Care Team: ?Juline Patch, MD as

## 2021-06-12 DIAGNOSIS — J439 Emphysema, unspecified: Secondary | ICD-10-CM | POA: Diagnosis not present

## 2021-06-12 DIAGNOSIS — R0609 Other forms of dyspnea: Secondary | ICD-10-CM | POA: Diagnosis not present

## 2021-08-22 ENCOUNTER — Other Ambulatory Visit: Payer: Self-pay | Admitting: Family Medicine

## 2021-08-22 DIAGNOSIS — M1990 Unspecified osteoarthritis, unspecified site: Secondary | ICD-10-CM

## 2021-09-18 ENCOUNTER — Ambulatory Visit: Payer: Medicare PPO | Admitting: Family Medicine

## 2021-09-18 ENCOUNTER — Encounter: Payer: Self-pay | Admitting: Family Medicine

## 2021-09-18 VITALS — BP 130/80 | HR 80 | Ht <= 58 in | Wt 132.0 lb

## 2021-09-18 DIAGNOSIS — J4520 Mild intermittent asthma, uncomplicated: Secondary | ICD-10-CM

## 2021-09-18 DIAGNOSIS — C189 Malignant neoplasm of colon, unspecified: Secondary | ICD-10-CM

## 2021-09-18 DIAGNOSIS — K219 Gastro-esophageal reflux disease without esophagitis: Secondary | ICD-10-CM | POA: Diagnosis not present

## 2021-09-18 DIAGNOSIS — I10 Essential (primary) hypertension: Secondary | ICD-10-CM

## 2021-09-18 DIAGNOSIS — E7849 Other hyperlipidemia: Secondary | ICD-10-CM | POA: Diagnosis not present

## 2021-09-18 DIAGNOSIS — J301 Allergic rhinitis due to pollen: Secondary | ICD-10-CM

## 2021-09-18 MED ORDER — OMEPRAZOLE 10 MG PO CPDR: 10.0000 mg | DELAYED_RELEASE_CAPSULE | Freq: Every day | ORAL | 1 refills | Status: DC

## 2021-09-18 MED ORDER — LOSARTAN POTASSIUM-HCTZ 100-25 MG PO TABS: 1.0000 | ORAL_TABLET | Freq: Every day | ORAL | 1 refills | Status: DC

## 2021-09-18 MED ORDER — FLUTICASONE PROPIONATE 50 MCG/ACT NA SUSP: NASAL | 1 refills | Status: DC

## 2021-09-18 MED ORDER — FEXOFENADINE HCL 180 MG PO TABS: 180.0000 mg | ORAL_TABLET | Freq: Every day | ORAL | 1 refills | Status: DC

## 2021-09-18 MED ORDER — MONTELUKAST SODIUM 10 MG PO TABS: ORAL_TABLET | ORAL | 1 refills | Status: DC

## 2021-09-18 MED ORDER — SIMVASTATIN 20 MG PO TABS: 20.0000 mg | ORAL_TABLET | Freq: Every day | ORAL | 1 refills | Status: DC

## 2021-09-18 NOTE — Progress Notes (Signed)
Date:  09/18/2021   Name:  Kristin Mata   DOB:  14-May-1950   MRN:  [id number]   Chief Complaint: Hyperlipidemia, Gastroesophageal Reflux, Allergic Rhinitis , and Hypertension  Hyperlipidemia This is a chronic problem. The current episode started more than 1 year ago. The problem is controlled. Recent lipid tests were reviewed and are normal. She has no history of chronic renal disease, diabetes or obesity. There are no known factors aggravating her hyperlipidemia. Pertinent negatives include no chest pain, myalgias or shortness of breath. Current antihyperlipidemic treatment includes statins. The current treatment provides moderate improvement of lipids. There are no compliance problems.   Gastroesophageal Reflux She reports no abdominal pain, no belching, no chest pain, no choking, no coughing, no dysphagia, no heartburn, no nausea, no sore throat or no wheezing. This is a chronic problem. The current episode started more than 1 year ago. The problem has been gradually improving. Nothing aggravates the symptoms. She has tried a PPI for the symptoms. The treatment provided moderate relief.  Hypertension This is a chronic problem. The current episode started more than 1 year ago. The problem has been gradually improving since onset. The problem is controlled. Pertinent negatives include no chest pain, headaches, neck pain, orthopnea, palpitations, PND or shortness of breath. Past treatments include angiotensin blockers and diuretics. There is no history of chronic renal disease.  URI  Chronicity: for allergic rhinitis. Pertinent negatives include no abdominal pain, chest pain, congestion, coughing, diarrhea, dysuria, ear pain, headaches, nausea, neck pain, plugged ear sensation, rash, rhinorrhea, sinus pain, sneezing, sore throat or wheezing.    Lab Results  Component Value Date   NA 141 03/21/2021   K 4.2 03/21/2021   CO2 25 03/21/2021   GLUCOSE 99 03/21/2021   BUN 12 03/21/2021    CREATININE 0.67 03/21/2021   CALCIUM 9.6 03/21/2021   EGFR 95 03/21/2021   GFRNONAA 97 09/30/2019   Lab Results  Component Value Date   CHOL 165 03/21/2021   HDL 58 03/21/2021   LDLCALC 89 03/21/2021   TRIG 102 03/21/2021   CHOLHDL 3.1 08/15/2017   No results found for: "TSH" No results found for: "HGBA1C" Lab Results  Component Value Date   WBC 8.4 03/11/2013   HGB 8.2 (L) 03/12/2013   HCT 21.5 (L) 03/11/2013   MCV 85 03/11/2013   PLT 395 03/11/2013   Lab Results  Component Value Date   ALT 20 03/21/2021   AST 23 03/21/2021   ALKPHOS 59 03/21/2021   BILITOT 0.4 03/21/2021   No results found for: "25OHVITD2", "25OHVITD3", "VD25OH"   Review of Systems  Constitutional:  Negative for chills and fever.  HENT:  Negative for congestion, drooling, ear discharge, ear pain, rhinorrhea, sinus pain, sneezing and sore throat.   Respiratory:  Negative for cough, choking, shortness of breath and wheezing.   Cardiovascular:  Negative for chest pain, palpitations, orthopnea, leg swelling and PND.  Gastrointestinal:  Negative for abdominal pain, blood in stool, constipation, diarrhea, dysphagia, heartburn and nausea.  Endocrine: Negative for polydipsia.  Genitourinary:  Negative for dysuria, frequency, hematuria and urgency.  Musculoskeletal:  Negative for back pain, myalgias and neck pain.  Skin:  Negative for rash.  Allergic/Immunologic: Negative for environmental allergies.  Neurological:  Negative for dizziness and headaches.  Hematological:  Does not bruise/bleed easily.  Psychiatric/Behavioral:  Negative for suicidal ideas. The patient is not nervous/anxious.     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   GERD (gastroesophageal reflux disease) 04/09/2013   GBS (Guillain Barre syndrome) ([place]) 04/09/2013   DDD  (degenerative disc disease), lumbar 04/09/2013   Colon cancer ([place]) 04/09/2013   Anemia 04/09/2013   Arthritis 03/13/2013    No Known Allergies  Past Surgical History:  Procedure Laterality Date   ABDOMINAL HYSTERECTOMY     BREAST CYST ASPIRATION Bilateral    neg   CATARACT EXTRACTION W/PHACO Right 10/18/2019   Procedure: CATARACT EXTRACTION PHACO AND INTRAOCULAR LENS PLACEMENT ([place]) RIGHT 2.35  00:26.5;  Surgeon: Eulogio Bear, MD;  Location: [place];  Service: Ophthalmology;  Laterality: Right;   CATARACT EXTRACTION W/PHACO Left 11/15/2019   Procedure: CATARACT EXTRACTION PHACO AND INTRAOCULAR LENS PLACEMENT ([place]) LEFT;  Surgeon: Eulogio Bear, MD;  Location: [place];  Service: Ophthalmology;  Laterality: Left;  1.50 0:19.1   COLON RESECTION     HAMMER TOE SURGERY     HAND SURGERY     HAND SURGERY     Thumb   ROTATOR CUFF REPAIR Right     Social History   Tobacco Use   Smoking status: Never   Smokeless tobacco: Never  Vaping Use   Vaping Use: Never used  Substance Use Topics   Alcohol use: Yes    Alcohol/week: 1.0 standard drink of alcohol    Types: 1 Standard drinks or equivalent per week   Drug use: No     Medication list has been reviewed and updated.  Current Meds  Medication Sig   albuterol (PROVENTIL HFA;VENTOLIN HFA) 108 (90 Base) MCG/ACT inhaler [place]   ascorbic acid (VITAMIN C) 250 MG tablet Take 1 tablet by mouth daily.   aspirin EC 81 MG tablet Take 81 mg by mouth daily.   Biotin 10 MG TABS Take 1 tablet by mouth daily.   Cholecalciferol 25 MCG (1000 UT) tablet Take 1 tablet by mouth daily.   ferrous sulfate 325 (65 FE) MG tablet Take 325 mg by mouth daily with breakfast.   fexofenadine (ALLEGRA) 180 MG tablet Take 1 tablet (180 mg total) by mouth daily.   fluticasone (FLONASE) 50 MCG/ACT nasal spray One puff each nostril q day   fluticasone-salmeterol (ADVAIR) 100-50 MCG/ACT AEPB Inhale into the lungs. Inhale 1  inhalation into the lungs every 12 (twelve) hours PATIENT NEEDS APPOINTMENT FOR ANY ADDITIONAL REFILLS   gabapentin (NEURONTIN) 300 MG capsule Take 1 capsule by mouth at bedtime. Pt taking 1 capsules QHS   losartan-hydrochlorothiazide (HYZAAR) 100-25 MG tablet Take 1 tablet by mouth daily.   magnesium 30 MG tablet Take 30 mg by mouth daily.   meloxicam (MOBIC) 15 MG tablet TAKE 1 TABLET (15 MG TOTAL) BY MOUTH DAILY.   montelukast (SINGULAIR) 10 MG tablet Take 1 tablet by mouth daily   Multiple Vitamin (MULTIVITAMIN) tablet Take 1 tablet by mouth daily.   Omega-3 Fatty Acids (FISH OIL) 1000 MG CAPS Take 1 capsule by mouth 2 (two) times daily.   omeprazole (PRILOSEC) 10 MG capsule Take 1 capsule (10 mg total) by mouth daily.   simvastatin (ZOCOR) 20 MG tablet Take 1 tablet (20 mg total) by mouth daily.   vitamin E 1000 UNIT capsule Take 1 capsule by mouth daily.       09/18/2021    9:03 AM 03/21/2021    9:31 AM 03/30/2020    9:13 AM  GAD 7 : Generalized Anxiety Score  Nervous, Anxious, on Edge 1  0 0  Control/stop worrying 1 0 0  Worry too much - different things 1 0 0  Trouble relaxing 1 0 0  Restless 1 0 0  Easily annoyed or irritable 1 0 0  Afraid - awful might happen 0 0 0  Total GAD 7 Score 6 0 0  Anxiety Difficulty Not difficult at all Not difficult at all        09/18/2021    9:03 AM 06/11/2021   11:03 AM 03/21/2021    9:31 AM  Depression screen PHQ 2/9  Decreased Interest 1 0 0  Down, Depressed, Hopeless 1 0 0  PHQ - 2 Score 2 0 0  Altered sleeping 2  0  Tired, decreased energy 1  0  Change in appetite 0  0  Feeling bad or failure about yourself  0  0  Trouble concentrating 0  0  Moving slowly or fidgety/restless 0  0  Suicidal thoughts 0  0  PHQ-9 Score 5  0  Difficult doing work/chores Not difficult at all  Not difficult at all    BP Readings from Last 3 Encounters:  09/18/21 130/80  03/21/21 122/62  09/19/20 130/80    Physical Exam Vitals and nursing note  reviewed.  Constitutional:      Appearance: She is well-developed.  HENT:     Head: Normocephalic.     Right Ear: Tympanic membrane and external ear normal.     Left Ear: Tympanic membrane and external ear normal.     Nose: Nose normal. No congestion or rhinorrhea.  Eyes:     General: Lids are everted, no foreign bodies appreciated. No scleral icterus.       Left eye: No foreign body or hordeolum.     Conjunctiva/sclera: Conjunctivae normal.     Right eye: Right conjunctiva is not injected.     Left eye: Left conjunctiva is not injected.     Pupils: Pupils are equal, round, and reactive to light.  Neck:     Thyroid: No thyromegaly.     Vascular: No JVD.     Trachea: No tracheal deviation.  Cardiovascular:     Rate and Rhythm: Normal rate and regular rhythm.     Heart sounds: Normal heart sounds. No murmur heard.    No friction rub. No gallop.  Pulmonary:     Effort: Pulmonary effort is normal. No respiratory distress.     Breath sounds: Normal breath sounds. No wheezing, rhonchi or rales.  Abdominal:     General: Bowel sounds are normal.     Palpations: Abdomen is soft. There is no mass.     Tenderness: There is no abdominal tenderness. There is no guarding or rebound.  Musculoskeletal:        General: No tenderness. Normal range of motion.     Cervical back: Normal range of motion and neck supple.  Lymphadenopathy:     Cervical: No cervical adenopathy.  Skin:    General: Skin is warm.     Findings: No rash.  Neurological:     Mental Status: She is alert and oriented to person, place, and time.     Cranial Nerves: No cranial nerve deficit.     Deep Tendon Reflexes: Reflexes normal.  Psychiatric:        Mood and Affect: Mood is not anxious or depressed.     Wt Readings from Last 3 Encounters:  09/18/21 132 lb (59.9 kg)  03/21/21 138 lb (62.6 kg)  09/19/20 140  lb (63.5 kg)    BP 130/80   Pulse 80   Ht '4\' 10"'  (1.473 m)   Wt 132 lb (59.9 kg)   BMI 27.59 kg/m    Assessment and Plan:  1. Essential hypertension Chronic.  Controlled.  Stable.  130/80.  Asymptomatic.  Tolerating medication well.  Continue losartan hydrochlorothiazide 125 mg once a day.  Review of previous renal panel from February of this year is acceptable. - losartan-hydrochlorothiazide (HYZAAR) 100-25 MG tablet; Take 1 tablet by mouth daily.  Dispense: 90 tablet; Refill: 1  2. Mild intermittent asthma without complication Chronic.  Controlled.  Stable.  Continue same 10 mg once a day. - montelukast (SINGULAIR) 10 MG tablet; Take 1 tablet by mouth daily  Dispense: 90 tablet; Refill: 1  3. Gastroesophageal reflux disease without esophagitis Chronic.  Controlled.  Stable.  Asymptomatic As she takes her PPI which is omeprazole 10 mg once a day. - omeprazole (PRILOSEC) 10 MG capsule; Take 1 capsule (10 mg total) by mouth daily.  Dispense: 90 capsule; Refill: 1  4. Familial multiple lipoprotein-type hyperlipidemia Chronic.  Controlled.  Stable.  Review of previous lipid panel.  We will continue simvastatin - simvastatin (ZOCOR) 20 MG tablet; Take 1 tablet (20 mg total) by mouth daily.  Dispense: 90 tablet; Refill: 1  5. Seasonal allergic rhinitis due to pollen Chronic.  Controlled.  Stable.  Husband's recent amputation of the toe.  Patient is doing well with combination Allegra/Flonase/Singulair. - fexofenadine (ALLEGRA) 180 MG tablet; Take 1 tablet (180 mg total) by mouth daily.  Dispense: 90 tablet; Refill: 1 - fluticasone (FLONASE) 50 MCG/ACT nasal spray; One puff each nostril q day  Dispense: 48 mL; Refill: 1 - montelukast (SINGULAIR) 10 MG tablet; Take 1 tablet by mouth daily  Dispense: 90 tablet; Refill: 1  6. Malignant neoplasm of colon, unspecified part of colon ([place]) Review of patient's: History that was done at [facility] and follows with oncology.  Reveals that her metastatic disease is to be followed up on with colonoscopy in 2022.  Patient states she will contact her  oncologist/colonoscopy as to be scheduling this as that it is already passed.   Otilio Miu, MD

## 2021-11-12 ENCOUNTER — Other Ambulatory Visit: Payer: Self-pay | Admitting: Family Medicine

## 2021-11-12 DIAGNOSIS — M1990 Unspecified osteoarthritis, unspecified site: Secondary | ICD-10-CM

## 2022-01-29 ENCOUNTER — Encounter: Payer: Self-pay | Admitting: Certified Nurse Midwife

## 2022-01-29 ENCOUNTER — Other Ambulatory Visit: Payer: Self-pay | Admitting: Certified Nurse Midwife

## 2022-01-29 DIAGNOSIS — Z1231 Encounter for screening mammogram for malignant neoplasm of breast: Secondary | ICD-10-CM

## 2022-02-04 ENCOUNTER — Other Ambulatory Visit: Payer: Self-pay | Admitting: Family Medicine

## 2022-02-04 DIAGNOSIS — M1990 Unspecified osteoarthritis, unspecified site: Secondary | ICD-10-CM

## 2022-02-05 NOTE — Telephone Encounter (Signed)
Requested medication (s) are due for refill today: yes  Requested medication (s) are on the active medication list: yes  Last refill:  11/13/21 #90/0  Future visit scheduled: yes  Notes to clinic:  Unable to refill per protocol due to failed labs, no updated results.    Requested Prescriptions  Pending Prescriptions Disp Refills   meloxicam (MOBIC) 15 MG tablet [Pharmacy Med Name: MELOXICAM 15 MG TABLET] 90 tablet 0    Sig: TAKE 1 TABLET (15 MG TOTAL) BY MOUTH DAILY.     Analgesics:  COX2 Inhibitors Failed - 02/04/2022  1:37 PM      Failed - Manual Review: Labs are only required if the patient has taken medication for more than 8 weeks.      Failed - HGB in normal range and within 360 days    HGB  Date Value Ref Range Status  03/12/2013 8.2 (L) 12.0 - 16.0 g/dL Final         Failed - HCT in normal range and within 360 days    HCT  Date Value Ref Range Status  03/11/2013 21.5 (L) 35.0 - 47.0 % Final         Passed - Cr in normal range and within 360 days    Creatinine  Date Value Ref Range Status  03/11/2013 0.68 0.60 - 1.30 mg/dL Final   Creatinine, Ser  Date Value Ref Range Status  03/21/2021 0.67 0.57 - 1.00 mg/dL Final         Passed - AST in normal range and within 360 days    AST  Date Value Ref Range Status  03/21/2021 23 0 - 40 IU/L Final   SGOT(AST)  Date Value Ref Range Status  03/10/2013 46 (H) 15 - 37 Unit/L Final         Passed - ALT in normal range and within 360 days    ALT  Date Value Ref Range Status  03/21/2021 20 0 - 32 IU/L Final   SGPT (ALT)  Date Value Ref Range Status  03/10/2013 12 12 - 78 U/L Final         Passed - eGFR is 30 or above and within 360 days    EGFR (African American)  Date Value Ref Range Status  03/11/2013 >60  Final   GFR calc Af Amer  Date Value Ref Range Status  09/30/2019 112 >59 mL/min/1.73 Final    Comment:    **[facility] currently reports eGFR in compliance with the current**   recommendations of the  [employer]. [facility] will   update reporting as new guidelines are published from the NKF-ASN   Task force.    EGFR (Non-African Amer.)  Date Value Ref Range Status  03/11/2013 >60  Final    Comment:    eGFR values <66m/min/1.73 m2 may be an indication of chronic kidney disease (CKD). Calculated eGFR is useful in patients with stable renal function. The eGFR calculation will not be reliable in acutely ill patients when serum creatinine is changing rapidly. It is not useful in  patients on dialysis. The eGFR calculation may not be applicable to patients at the low and high extremes of body sizes, pregnant women, and vegetarians.    GFR calc non Af Amer  Date Value Ref Range Status  09/30/2019 97 >59 mL/min/1.73 Final   eGFR  Date Value Ref Range Status  03/21/2021 95 >59 mL/min/1.73 Final         Passed - Patient is not pregnant  Passed - Valid encounter within last 12 months    Recent Outpatient Visits           4 months ago Essential hypertension   [place] [facility] [place], Deanna C, MD   10 months ago Essential hypertension   [place] [facility] [place], Deanna C, MD   1 year ago Essential hypertension   [place] [facility] [place], Deanna C, MD   1 year ago Taking medication for chronic disease    [facility] at [place], Deanna C, MD   2 years ago Acute non-recurrent sinusitis of other sinus   [facility] [place], [place], MD       Future Appointments             In 1 month Juline Patch, MD [place] [facility], [facility]

## 2022-02-25 ENCOUNTER — Ambulatory Visit
Admission: RE | Admit: 2022-02-25 | Discharge: 2022-02-25 | Disposition: A | Discharge status: Home / Self Care | Payer: Medicare PPO | Source: Ambulatory Visit | Attending: Certified Nurse Midwife | Admitting: Certified Nurse Midwife

## 2022-02-25 DIAGNOSIS — Z1231 Encounter for screening mammogram for malignant neoplasm of breast: Secondary | ICD-10-CM | POA: Diagnosis not present

## 2022-03-21 ENCOUNTER — Ambulatory Visit
Admission: RE | Admit: 2022-03-21 | Discharge: 2022-03-21 | Disposition: A | Discharge status: Home / Self Care | Payer: Medicare PPO | Source: Ambulatory Visit | Attending: Family Medicine | Admitting: Family Medicine

## 2022-03-21 ENCOUNTER — Ambulatory Visit
Admission: RE | Admit: 2022-03-21 | Discharge: 2022-03-21 | Disposition: A | Discharge status: Home / Self Care | Payer: Medicare PPO | Attending: Family Medicine | Admitting: Family Medicine

## 2022-03-21 ENCOUNTER — Encounter: Payer: Self-pay | Admitting: Family Medicine

## 2022-03-21 ENCOUNTER — Ambulatory Visit: Payer: Medicare PPO | Admitting: Family Medicine

## 2022-03-21 VITALS — BP 138/80 | HR 72 | Ht <= 58 in | Wt 134.0 lb

## 2022-03-21 DIAGNOSIS — I10 Essential (primary) hypertension: Secondary | ICD-10-CM | POA: Diagnosis not present

## 2022-03-21 DIAGNOSIS — J4520 Mild intermittent asthma, uncomplicated: Secondary | ICD-10-CM

## 2022-03-21 DIAGNOSIS — M1990 Unspecified osteoarthritis, unspecified site: Secondary | ICD-10-CM | POA: Diagnosis not present

## 2022-03-21 DIAGNOSIS — K219 Gastro-esophageal reflux disease without esophagitis: Secondary | ICD-10-CM

## 2022-03-21 DIAGNOSIS — J301 Allergic rhinitis due to pollen: Secondary | ICD-10-CM

## 2022-03-21 DIAGNOSIS — Z1211 Encounter for screening for malignant neoplasm of colon: Secondary | ICD-10-CM | POA: Diagnosis not present

## 2022-03-21 DIAGNOSIS — E7849 Other hyperlipidemia: Secondary | ICD-10-CM

## 2022-03-21 DIAGNOSIS — M19041 Primary osteoarthritis, right hand: Secondary | ICD-10-CM | POA: Diagnosis not present

## 2022-03-21 MED ORDER — MELOXICAM 15 MG PO TABS: 15.0000 mg | ORAL_TABLET | Freq: Every day | ORAL | 0 refills | Status: DC

## 2022-03-21 MED ORDER — SIMVASTATIN 20 MG PO TABS: 20.0000 mg | ORAL_TABLET | Freq: Every day | ORAL | 1 refills | Status: DC

## 2022-03-21 MED ORDER — OMEPRAZOLE 10 MG PO CPDR: 10.0000 mg | DELAYED_RELEASE_CAPSULE | Freq: Every day | ORAL | 1 refills | Status: DC

## 2022-03-21 MED ORDER — FEXOFENADINE HCL 180 MG PO TABS: 180.0000 mg | ORAL_TABLET | Freq: Every day | ORAL | 1 refills | Status: DC

## 2022-03-21 MED ORDER — FLUTICASONE PROPIONATE 50 MCG/ACT NA SUSP: NASAL | 1 refills | Status: DC

## 2022-03-21 MED ORDER — LOSARTAN POTASSIUM-HCTZ 100-25 MG PO TABS: 1.0000 | ORAL_TABLET | Freq: Every day | ORAL | 1 refills | Status: DC

## 2022-03-21 MED ORDER — MONTELUKAST SODIUM 10 MG PO TABS: ORAL_TABLET | ORAL | 1 refills | Status: DC

## 2022-03-21 NOTE — Progress Notes (Signed)
Date:  03/21/2022   Name:  Kristin Mata   DOB:  09-14-1950   MRN:  [id number]   Chief Complaint: colon cancer repeat, Allergic Rhinitis , Hypertension, Hyperlipidemia, and Arthritis  Hypertension This is a chronic problem. The current episode started more than 1 year ago. The problem has been gradually improving since onset. The problem is controlled. Pertinent negatives include no blurred vision, chest pain, headaches, palpitations, PND or shortness of breath. There are no associated agents to hypertension. Risk factors for coronary artery disease include dyslipidemia and diabetes mellitus. Past treatments include angiotensin blockers and diuretics. The current treatment provides moderate improvement. There are no compliance problems.  There is no history of angina, kidney disease, CAD/MI, CVA, heart failure, left ventricular hypertrophy, PVD or retinopathy. There is no history of chronic renal disease, a hypertension causing med or renovascular disease.  Hyperlipidemia This is a chronic problem. The current episode started more than 1 year ago. The problem is controlled. Recent lipid tests were reviewed and are normal. She has no history of chronic renal disease. Pertinent negatives include no chest pain, focal sensory loss, myalgias or shortness of breath. Current antihyperlipidemic treatment includes statins. The current treatment provides moderate improvement of lipids. There are no compliance problems.   Arthritis Presents for follow-up visit. She complains of pain, stiffness and joint swelling. Symptom course: waxes and wanes. Affected locations include the right PIP (middle finger). Pertinent negatives include no diarrhea, dysuria, fatigue, fever or rash.    Lab Results  Component Value Date   NA 141 03/21/2021   K 4.2 03/21/2021   CO2 25 03/21/2021   GLUCOSE 99 03/21/2021   BUN 12 03/21/2021   CREATININE 0.67 03/21/2021   CALCIUM 9.6 03/21/2021   EGFR 95 03/21/2021    GFRNONAA 97 09/30/2019   Lab Results  Component Value Date   CHOL 165 03/21/2021   HDL 58 03/21/2021   LDLCALC 89 03/21/2021   TRIG 102 03/21/2021   CHOLHDL 3.1 08/15/2017   No results found for: "TSH" No results found for: "HGBA1C" Lab Results  Component Value Date   WBC 8.4 03/11/2013   HGB 8.2 (L) 03/12/2013   HCT 21.5 (L) 03/11/2013   MCV 85 03/11/2013   PLT 395 03/11/2013   Lab Results  Component Value Date   ALT 20 03/21/2021   AST 23 03/21/2021   ALKPHOS 59 03/21/2021   BILITOT 0.4 03/21/2021   No results found for: "25OHVITD2", "25OHVITD3", "VD25OH"   Review of Systems  Constitutional: Negative.  Negative for chills, fatigue, fever and unexpected weight change.  HENT:  Negative for congestion, ear discharge, ear pain, rhinorrhea, sinus pressure, sneezing and sore throat.   Eyes:  Negative for blurred vision.  Respiratory:  Negative for cough, shortness of breath, wheezing and stridor.   Cardiovascular:  Negative for chest pain, palpitations and PND.  Gastrointestinal:  Negative for abdominal pain, blood in stool, constipation, diarrhea and nausea.  Genitourinary:  Negative for dysuria, flank pain, frequency, hematuria, urgency and vaginal discharge.  Musculoskeletal:  Positive for arthritis, joint swelling and stiffness. Negative for arthralgias, back pain and myalgias.  Skin:  Negative for rash.  Neurological:  Negative for dizziness, weakness and headaches.  Hematological:  Negative for adenopathy. Does not bruise/bleed easily.  Psychiatric/Behavioral:  Negative for dysphoric mood. The patient is not nervous/anxious.     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   GERD (gastroesophageal reflux disease) 04/09/2013   GBS (Guillain Barre syndrome) ([place]) 04/09/2013   DDD (degenerative disc disease),  lumbar 04/09/2013   Colon cancer ([place]) 04/09/2013   Anemia 04/09/2013   Arthritis 03/13/2013    No Known Allergies  Past Surgical History:  Procedure Laterality Date   ABDOMINAL HYSTERECTOMY     BREAST CYST ASPIRATION Bilateral    neg   CATARACT EXTRACTION W/PHACO Right 10/18/2019   Procedure: CATARACT EXTRACTION PHACO AND INTRAOCULAR LENS PLACEMENT ([place]) RIGHT 2.35  00:26.5;  Surgeon: Eulogio Bear, MD;  Location: [facility];  Service: Ophthalmology;  Laterality: Right;   CATARACT EXTRACTION W/PHACO Left 11/15/2019   Procedure: CATARACT EXTRACTION PHACO AND INTRAOCULAR LENS PLACEMENT ([place]) LEFT;  Surgeon: Eulogio Bear, MD;  Location: [facility];  Service: Ophthalmology;  Laterality: Left;  1.50 0:19.1   COLON RESECTION     HAMMER TOE SURGERY     HAND SURGERY     HAND SURGERY     Thumb   ROTATOR CUFF REPAIR Right     Social History   Tobacco Use   Smoking status: Never   Smokeless tobacco: Never  Vaping Use   Vaping Use: Never used  Substance Use Topics   Alcohol use: Yes    Alcohol/week: 1.0 standard drink of alcohol    Types: 1 Standard drinks or equivalent per week   Drug use: No     Medication list has been reviewed and updated.  Current Meds  Medication Sig   albuterol (PROVENTIL HFA;VENTOLIN HFA) 108 (90 Base) MCG/ACT inhaler [place]   ascorbic acid (VITAMIN C) 250 MG tablet Take 1 tablet by mouth daily.   aspirin EC 81 MG tablet Take 81 mg by mouth daily.   Biotin 10 MG TABS Take 1 tablet by mouth daily.   Cholecalciferol 25 MCG (1000 UT) tablet Take 1 tablet by mouth daily.   ferrous sulfate 325 (65 FE) MG tablet Take 325 mg by mouth daily with breakfast.   fexofenadine (ALLEGRA) 180 MG tablet Take 1 tablet (180 mg total) by mouth daily.   fluticasone (FLONASE) 50 MCG/ACT nasal spray One puff each nostril q day   fluticasone-salmeterol (ADVAIR) 100-50 MCG/ACT AEPB Inhale into the lungs. Inhale 1 inhalation into the lungs every  12 (twelve) hours PATIENT NEEDS APPOINTMENT FOR ANY ADDITIONAL REFILLS   gabapentin (NEURONTIN) 300 MG capsule Take 1 capsule by mouth at bedtime. Pt taking 1 capsules QHS   losartan-hydrochlorothiazide (HYZAAR) 100-25 MG tablet Take 1 tablet by mouth daily.   magnesium 30 MG tablet Take 30 mg by mouth daily.   meloxicam (MOBIC) 15 MG tablet TAKE 1 TABLET (15 MG TOTAL) BY MOUTH DAILY.   montelukast (SINGULAIR) 10 MG tablet Take 1 tablet by mouth daily   Multiple Vitamin (MULTIVITAMIN) tablet Take 1 tablet by mouth daily.   Omega-3 Fatty Acids (FISH OIL) 1000 MG CAPS Take 1 capsule by mouth 2 (two) times daily.   omeprazole (PRILOSEC) 10 MG capsule Take 1 capsule (10 mg total) by mouth daily.   simvastatin (ZOCOR) 20 MG tablet Take 1 tablet (20 mg total) by mouth daily.   vitamin E 1000 UNIT capsule Take 1 capsule by mouth daily.       03/21/2022    9:56 AM 09/18/2021    9:03 AM 03/21/2021    9:31 AM 03/30/2020    9:13 AM  GAD 7 : Generalized Anxiety Score  Nervous, Anxious, on Edge 0 1  0 0  Control/stop worrying 0 1 0 0  Worry too much - different things 0 1 0 0  Trouble relaxing 0 1 0 0  Restless 0 1 0 0  Easily annoyed or irritable 0 1 0 0  Afraid - awful might happen 0 0 0 0  Total GAD 7 Score 0 6 0 0  Anxiety Difficulty Not difficult at all Not difficult at all Not difficult at all        03/21/2022    9:56 AM 09/18/2021    9:03 AM 06/11/2021   11:03 AM  Depression screen PHQ 2/9  Decreased Interest 0 1 0  Down, Depressed, Hopeless 0 1 0  PHQ - 2 Score 0 2 0  Altered sleeping 0 2   Tired, decreased energy 0 1   Change in appetite 0 0   Feeling bad or failure about yourself  0 0   Trouble concentrating 0 0   Moving slowly or fidgety/restless 0 0   Suicidal thoughts 0 0   PHQ-9 Score 0 5   Difficult doing work/chores Not difficult at all Not difficult at all     BP Readings from Last 3 Encounters:  03/21/22 (!) 142/70  09/18/21 130/80  03/21/21 122/62    Physical  Exam Vitals and nursing note reviewed. Exam conducted with a chaperone present.  Constitutional:      General: She is not in acute distress.    Appearance: She is not diaphoretic.  HENT:     Head: Normocephalic and atraumatic.     Right Ear: External ear normal.     Left Ear: External ear normal.     Nose: Nose normal.  Eyes:     General:        Right eye: No discharge.        Left eye: No discharge.     Conjunctiva/sclera: Conjunctivae normal.     Pupils: Pupils are equal, round, and reactive to light.  Neck:     Thyroid: No thyromegaly.     Vascular: No JVD.  Cardiovascular:     Rate and Rhythm: Normal rate and regular rhythm.     Heart sounds: Normal heart sounds. No murmur heard.    No friction rub. No gallop.  Pulmonary:     Effort: Pulmonary effort is normal.     Breath sounds: Normal breath sounds.  Abdominal:     General: Bowel sounds are normal.     Palpations: Abdomen is soft. There is no mass.     Tenderness: There is no abdominal tenderness. There is no guarding.  Musculoskeletal:     Left hand: Deformity and tenderness present. Decreased range of motion.     Cervical back: Normal range of motion and neck supple.  Lymphadenopathy:     Cervical: No cervical adenopathy.  Skin:    General: Skin is warm and dry.  Neurological:     Mental Status: She is alert.     Deep Tendon Reflexes:     Reflex Scores:      Patellar reflexes are 1+ on the right side and 1+ on the left side.    Wt Readings from Last 3 Encounters:  03/21/22 134 lb (60.8 kg)  09/18/21 132 lb (59.9 kg)  03/21/21 138 lb (62.6 kg)    BP (!) 142/70 (BP Location: Right Arm, Cuff Size: Large)   Pulse 72   Ht 4' 10"$  (1.473 m)   Wt 134 lb (60.8 kg)   SpO2 97%  BMI 28.01 kg/m   Assessment and Plan:  1. Essential hypertension Chronic.  Controlled.  Stable.  Blood pressure 138/80.  Continue losartan hydrochlorothiazide 100-25 mg once a day.  Will check CMP for electrolytes and GFR status.   Will recheck patient in 6 months. - losartan-hydrochlorothiazide (HYZAAR) 100-25 MG tablet; Take 1 tablet by mouth daily.  Dispense: 90 tablet; Refill: 1 - Comprehensive Metabolic Panel (CMET)  2. Seasonal allergic rhinitis due to pollen Chronic.  Controlled.  Stable.  This is dependent on the pollen season and as we enter the spring patient has been encouraged to continue her Allegra 180 mg once a day, Flonase nasal spray 1 puff each nostril once a day, and Singulair 10 mg once a day. - fexofenadine (ALLEGRA) 180 MG tablet; Take 1 tablet (180 mg total) by mouth daily.  Dispense: 90 tablet; Refill: 1 - fluticasone (FLONASE) 50 MCG/ACT nasal spray; One puff each nostril q day  Dispense: 48 mL; Refill: 1 - montelukast (SINGULAIR) 10 MG tablet; Take 1 tablet by mouth daily  Dispense: 90 tablet; Refill: 1  3. Arthritis Chronic.  Controlled.  Stable.  Fair control with meloxicam but patient is having good days bad days with her finger.  We will check an x-ray of her right middle finger to get some idea of current degree of arthritis.  If significant our next step will be to refer to Dr. Marry Guan for evaluation. - meloxicam (MOBIC) 15 MG tablet; Take 1 tablet (15 mg total) by mouth daily.  Dispense: 90 tablet; Refill: 0 - DG Finger Middle Right; Future  4. Mild intermittent asthma without complication Chronic.  Controlled.  Stable.  Continue Singulair 10 mg once a day. - montelukast (SINGULAIR) 10 MG tablet; Take 1 tablet by mouth daily  Dispense: 90 tablet; Refill: 1  5. Gastroesophageal reflux disease without esophagitis Chronic.  Controlled.  Stable.  Continue omeprazole 10 mg once a day. - omeprazole (PRILOSEC) 10 MG capsule; Take 1 capsule (10 mg total) by mouth daily.  Dispense: 90 capsule; Refill: 1  6. Familial multiple lipoprotein-type hyperlipidemia Chronic.  Controlled.  Stable.  Continue simvastatin 20 mg once a day.  Will check lipid panel for current status of control. - simvastatin  (ZOCOR) 20 MG tablet; Take 1 tablet (20 mg total) by mouth daily.  Dispense: 90 tablet; Refill: 1 - Lipid Panel With LDL/HDL Ratio  7. Colon cancer screening Chronic.  Controlled.  Stable.  Will refer to gastroenterology for cancer screening. - Ambulatory referral to Gastroenterology    Otilio Miu, MD

## 2022-03-22 LAB — COMPREHENSIVE METABOLIC PANEL
ALT: 29 IU/L (ref 0–32)
AST: 25 IU/L (ref 0–40)
Albumin/Globulin Ratio: 1.9 (ref 1.2–2.2)
Albumin: 4.3 g/dL (ref 3.9–4.9)
Alkaline Phosphatase: 73 IU/L (ref 44–121)
BUN/Creatinine Ratio: 13 (ref 12–28)
BUN: 8 mg/dL (ref 8–27)
Bilirubin Total: 0.4 mg/dL (ref 0.0–1.2)
CO2: 25 mmol/L (ref 20–29)
Calcium: 9.7 mg/dL (ref 8.7–10.3)
Chloride: 100 mmol/L (ref 96–106)
Creatinine, Ser: 0.63 mg/dL (ref 0.57–1.00)
Globulin, Total: 2.3 g/dL (ref 1.5–4.5)
Glucose: 90 mg/dL (ref 70–99)
Potassium: 4.5 mmol/L (ref 3.5–5.2)
Sodium: 139 mmol/L (ref 134–144)
Total Protein: 6.6 g/dL (ref 6.0–8.5)
eGFR: 96 mL/min/{1.73_m2} (ref >59)

## 2022-03-22 LAB — LIPID PANEL WITH LDL/HDL RATIO
Cholesterol, Total: 200 mg/dL — ABNORMAL HIGH (ref 100–199)
HDL: 53 mg/dL (ref >39)
LDL Chol Calc (NIH): 109 mg/dL — ABNORMAL HIGH (ref 0–99)
LDL/HDL Ratio: 2.1 ratio (ref 0.0–3.2)
Triglycerides: 220 mg/dL — ABNORMAL HIGH (ref 0–149)
VLDL Cholesterol Cal: 38 mg/dL (ref 5–40)

## 2022-03-29 ENCOUNTER — Encounter: Payer: Self-pay | Admitting: *Deleted

## 2022-04-02 ENCOUNTER — Encounter: Payer: Self-pay | Admitting: Family Medicine

## 2022-04-02 ENCOUNTER — Ambulatory Visit: Payer: Medicare PPO | Admitting: Family Medicine

## 2022-04-02 VITALS — BP 130/68 | HR 80 | Ht <= 58 in | Wt 134.0 lb

## 2022-04-02 DIAGNOSIS — M792 Neuralgia and neuritis, unspecified: Secondary | ICD-10-CM

## 2022-04-02 NOTE — Progress Notes (Signed)
     Primary Care / Sports Medicine Office Visit  Patient Information:  Patient ID: Kristin Mata, female DOB: [birth year] Age: 72 y.o. MRN: [id number]   Sandra Kosmalski is a pleasant 72 y.o. female presenting with the following:  Chief Complaint  Patient presents with   Hand Pain    Right middle finger comes and goes for a while, night time is worst    Vitals:   04/02/22 0942  BP: 130/68  Pulse: 80  SpO2: 94%   Vitals:   04/02/22 0942  Weight: 134 lb (60.8 kg)  Height: '4\' 10"'$  (1.473 m)   Body mass index is 28.01 kg/m.     Independent interpretation of notes and tests performed by another provider:   Independent interpretation of right third digit x-rays dated 03/21/2022 reveal severe, disfigurative and asymmetric osteoarthritis maximally at the PIP, secondarily at the DIP, additionally noted at the second PIP and DIP  Procedures performed:   None  Pertinent History, Exam, Impression, and Recommendations:   Kristin Mata was seen today for hand pain.  Neuropathic pain of finger, right Assessment & Plan: Chronic condition in the setting of comorbid severe osteoarthritis.  Patient cites longstanding issues with right third digit involving deformation, denies any issues with function or pain throughout the day, her main concern is awakening in the night with third digit numbness, never experienced with ADLs.  Is on meloxicam and gabapentin 200 mg nightly.  Examination with third digit changes consistent with radiographic osteoarthritis, range of motion consistent with the same though is otherwise full and painless, strength preserved, sensorimotor intact in the radial, median, and ulnar distributions, negative Tinel's, nontender deep palpation throughout the MCP, PIP and DIP of the third digit.  Negative Spurling's bilaterally.  Given patient's findings, subjective reports, specific character of pain, previous CT cervical spine from 2015 reviewed as well, noted degenerative  changes which can account for her stated symptomatology.  Did discuss the same with the patient and plan for titration of gabapentin 300 mg nightly, continue meloxicam, start of home-based rehab for the cervical spine, close follow-up in 4 weeks.  Can consider alternate treatment with duloxetine if indicated.      Orders & Medications No orders of the defined types were placed in this encounter.  No orders of the defined types were placed in this encounter.    Return in about 4 weeks (around 04/30/2022).     Montel Culver, MD, [facility]   Primary Care Sports Medicine Primary Care and Sports Medicine at [facility]

## 2022-04-02 NOTE — Patient Instructions (Signed)
-   Increase gabapentin to 300 mg nightly - Start home exercises with information provided - Utilize moist/warm/humidified heat around the neck for additional symptom control - Return for follow-up in 4 weeks, contact for questions/concerns between now and then

## 2022-04-02 NOTE — Assessment & Plan Note (Signed)
Chronic condition in the setting of comorbid severe osteoarthritis.  Patient cites longstanding issues with right third digit involving deformation, denies any issues with function or pain throughout the day, her main concern is awakening in the night with third digit numbness, never experienced with ADLs.  Is on meloxicam and gabapentin 200 mg nightly.  Examination with third digit changes consistent with radiographic osteoarthritis, range of motion consistent with the same though is otherwise full and painless, strength preserved, sensorimotor intact in the radial, median, and ulnar distributions, negative Tinel's, nontender deep palpation throughout the MCP, PIP and DIP of the third digit.  Negative Spurling's bilaterally.  Given patient's findings, subjective reports, specific character of pain, previous CT cervical spine from 2015 reviewed as well, noted degenerative changes which can account for her stated symptomatology.  Did discuss the same with the patient and plan for titration of gabapentin 300 mg nightly, continue meloxicam, start of home-based rehab for the cervical spine, close follow-up in 4 weeks.  Can consider alternate treatment with duloxetine if indicated.

## 2022-04-23 ENCOUNTER — Ambulatory Visit (INDEPENDENT_AMBULATORY_CARE_PROVIDER_SITE_OTHER): Payer: Medicare PPO | Admitting: Family Medicine

## 2022-04-23 VITALS — BP 120/80 | HR 88 | Ht <= 58 in | Wt 135.0 lb

## 2022-04-23 DIAGNOSIS — H6123 Impacted cerumen, bilateral: Secondary | ICD-10-CM | POA: Diagnosis not present

## 2022-04-23 NOTE — Progress Notes (Signed)
Date:  04/23/2022   Name:  Kristin Mata   DOB:  02/05/1950   MRN:  [id number]   Chief Complaint: Ear Fullness and Sinusitis (Facial pressure, ears hurt)  Ear Fullness  There is pain in both ears. The current episode started more than 1 month ago. The pain is mild. Associated symptoms include hearing loss. Pertinent negatives include no ear discharge. Treatments tried: debrox/bulb syringe. The treatment provided no relief.    Lab Results  Component Value Date   NA 139 03/21/2022   K 4.5 03/21/2022   CO2 25 03/21/2022   GLUCOSE 90 03/21/2022   BUN 8 03/21/2022   CREATININE 0.63 03/21/2022   CALCIUM 9.7 03/21/2022   EGFR 96 03/21/2022   GFRNONAA 97 09/30/2019   Lab Results  Component Value Date   CHOL 200 (H) 03/21/2022   HDL 53 03/21/2022   LDLCALC 109 (H) 03/21/2022   TRIG 220 (H) 03/21/2022   CHOLHDL 3.1 08/15/2017   No results found for: "TSH" No results found for: "HGBA1C" Lab Results  Component Value Date   WBC 8.4 03/11/2013   HGB 8.2 (L) 03/12/2013   HCT 21.5 (L) 03/11/2013   MCV 85 03/11/2013   PLT 395 03/11/2013   Lab Results  Component Value Date   ALT 29 03/21/2022   AST 25 03/21/2022   ALKPHOS 73 03/21/2022   BILITOT 0.4 03/21/2022   No results found for: "25OHVITD2", "25OHVITD3", "VD25OH"   Review of Systems  HENT:  Positive for hearing loss. Negative for ear discharge.     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   GERD (gastroesophageal reflux disease) 04/09/2013   GBS (Guillain Barre syndrome) ([place]) 04/09/2013   DDD (degenerative disc disease), lumbar 04/09/2013   Colon cancer ([place]) 04/09/2013   Anemia 04/09/2013   Arthritis 03/13/2013    No Known Allergies  Past Surgical History:  Procedure Laterality Date   ABDOMINAL  HYSTERECTOMY     BREAST CYST ASPIRATION Bilateral    neg   CATARACT EXTRACTION W/PHACO Right 10/18/2019   Procedure: CATARACT EXTRACTION PHACO AND INTRAOCULAR LENS PLACEMENT ([place]) RIGHT 2.35  00:26.5;  Surgeon: Eulogio Bear, MD;  Location: [place];  Service: Ophthalmology;  Laterality: Right;   CATARACT EXTRACTION W/PHACO Left 11/15/2019   Procedure: CATARACT EXTRACTION PHACO AND INTRAOCULAR LENS PLACEMENT ([place]) LEFT;  Surgeon: Eulogio Bear, MD;  Location: [place];  Service: Ophthalmology;  Laterality: Left;  1.50 0:19.1   COLON RESECTION     HAMMER TOE SURGERY     HAND SURGERY     HAND SURGERY     Thumb   ROTATOR CUFF REPAIR Right     Social History   Tobacco Use   Smoking status: Never   Smokeless tobacco: Never  Vaping Use   Vaping Use: Never used  Substance Use Topics   Alcohol use: Yes    Alcohol/week: 1.0 standard drink of alcohol    Types: 1 Standard drinks or equivalent per week   Drug use: No     Medication list has been reviewed and updated.  No outpatient medications have been marked as taking for the 04/23/22 encounter (Office Visit) with Juline Patch, MD.       04/23/2022    3:27 PM 03/21/2022    9:56 AM 09/18/2021  9:03 AM 03/21/2021    9:31 AM  GAD 7 : Generalized Anxiety Score  Nervous, Anxious, on Edge 0 0 1 0  Control/stop worrying 0 0 1 0  Worry too much - different things 0 0 1 0  Trouble relaxing 0 0 1 0  Restless 0 0 1 0  Easily annoyed or irritable 0 0 1 0  Afraid - awful might happen 0 0 0 0  Total GAD 7 Score 0 0 6 0  Anxiety Difficulty Not difficult at all Not difficult at all Not difficult at all Not difficult at all       04/23/2022    3:27 PM 03/21/2022    9:56 AM 09/18/2021    9:03 AM  Depression screen PHQ 2/9  Decreased Interest 0 0 1  Down, Depressed, Hopeless 0 0 1  PHQ - 2 Score 0 0 2  Altered sleeping 0 0 2  Tired, decreased energy 0 0 1  Change in appetite 0 0 0  Feeling bad or  failure about yourself  0 0 0  Trouble concentrating 0 0 0  Moving slowly or fidgety/restless 0 0 0  Suicidal thoughts 0 0 0  PHQ-9 Score 0 0 5  Difficult doing work/chores Not difficult at all Not difficult at all Not difficult at all    BP Readings from Last 3 Encounters:  04/23/22 120/80  04/02/22 130/68  03/21/22 138/80    Physical Exam Vitals and nursing note reviewed.  HENT:     Right Ear: Tympanic membrane and ear canal normal. There is impacted cerumen.     Left Ear: Tympanic membrane and ear canal normal. There is impacted cerumen.  Neurological:     Mental Status: She is alert.    Wt Readings from Last 3 Encounters:  04/23/22 135 lb (61.2 kg)  04/02/22 134 lb (60.8 kg)  03/21/22 134 lb (60.8 kg)    BP 120/80   Pulse 88   Ht 4\' 10"  (1.473 m)   Wt 135 lb (61.2 kg)   SpO2 95%   BMI 28.22 kg/m   Assessment and Plan:  1. Bilateral impacted cerumen Chronic.  Persistent.  Patient has been using Debrox with no resulting of removal of wax.  Careful irrigation with warm water removed with cerumen plugs and it was noted that the canal was erythematous with erythema to the tympanic membranes.  In addition to irrigation it was necessary to use cotton tip applicator to manually remove loosened cerumen plug from opening of the auditory canal.   Otilio Miu, MD

## 2022-04-30 ENCOUNTER — Ambulatory Visit: Payer: Medicare PPO | Admitting: Family Medicine

## 2022-05-05 ENCOUNTER — Other Ambulatory Visit: Payer: Self-pay | Admitting: Family Medicine

## 2022-05-05 DIAGNOSIS — M1990 Unspecified osteoarthritis, unspecified site: Secondary | ICD-10-CM

## 2022-05-06 ENCOUNTER — Other Ambulatory Visit: Payer: Self-pay | Admitting: Family Medicine

## 2022-05-06 DIAGNOSIS — J301 Allergic rhinitis due to pollen: Secondary | ICD-10-CM

## 2022-05-10 ENCOUNTER — Telehealth: Payer: Self-pay

## 2022-05-10 DIAGNOSIS — Z85038 Personal history of other malignant neoplasm of large intestine: Secondary | ICD-10-CM

## 2022-05-10 NOTE — Telephone Encounter (Signed)
Pt left message to schedule colonoscopy at the [facility] location please return call

## 2022-05-13 ENCOUNTER — Other Ambulatory Visit: Payer: Self-pay

## 2022-05-13 DIAGNOSIS — Z85038 Personal history of other malignant neoplasm of large intestine: Secondary | ICD-10-CM

## 2022-05-13 MED ORDER — NA SULFATE-K SULFATE-MG SULF 17.5-3.13-1.6 GM/177ML PO SOLN: 1.0000 | Freq: Once | ORAL | 0 refills | Status: AC

## 2022-05-13 NOTE — Telephone Encounter (Signed)
Gastroenterology Pre-Procedure Review  Request Date: 06/28/22 Requesting Physician: Dr. Servando Snare  PATIENT REVIEW QUESTIONS: The patient responded to the following health history questions as indicated:    1. Are you having any GI issues? no 2. Do you have a personal history of Polyps? yes (personal history of colon cancer 2015 part of colon removed) 3. Do you have a family history of Colon Cancer or Polyps? no 4. Diabetes Mellitus? no 5. Joint replacements in the past 12 months?no 6. Major health problems in the past 3 months?no 7. Any artificial heart valves, MVP, or defibrillator?no    MEDICATIONS & ALLERGIES:    Patient reports the following regarding taking any anticoagulation/antiplatelet therapy:   Plavix, Coumadin, Eliquis, Xarelto, Lovenox, Pradaxa, Brilinta, or Effient? no Aspirin? no  Patient confirms/reports the following medications:  Current Outpatient Medications  Medication Sig Dispense Refill   albuterol (PROVENTIL HFA;VENTOLIN HFA) 108 (90 Base) MCG/ACT inhaler Fleming     ascorbic acid (VITAMIN C) 250 MG tablet Take 1 tablet by mouth daily.     aspirin EC 81 MG tablet Take 81 mg by mouth daily.     Biotin 10 MG TABS Take 1 tablet by mouth daily.     Cholecalciferol 25 MCG (1000 UT) tablet Take 1 tablet by mouth daily.     ferrous sulfate 325 (65 FE) MG tablet Take 325 mg by mouth daily with breakfast.     fexofenadine (ALLEGRA) 180 MG tablet Take 1 tablet (180 mg total) by mouth daily. 90 tablet 1   fluticasone (FLONASE) 50 MCG/ACT nasal spray ONE PUFF EACH NOSTRIL EVERY DAY 48 mL 0   fluticasone-salmeterol (ADVAIR) 100-50 MCG/ACT AEPB Inhale into the lungs. Inhale 1 inhalation into the lungs every 12 (twelve) hours PATIENT NEEDS APPOINTMENT FOR ANY ADDITIONAL REFILLS     gabapentin (NEURONTIN) 300 MG capsule Take 1 capsule by mouth at bedtime. Pt taking 1 capsules QHS     losartan-hydrochlorothiazide (HYZAAR) 100-25 MG tablet Take 1 tablet by mouth daily. 90 tablet 1    magnesium 30 MG tablet Take 30 mg by mouth daily.     meloxicam (MOBIC) 15 MG tablet TAKE 1 TABLET (15 MG TOTAL) BY MOUTH DAILY. 90 tablet 0   montelukast (SINGULAIR) 10 MG tablet Take 1 tablet by mouth daily 90 tablet 1   Multiple Vitamin (MULTIVITAMIN) tablet Take 1 tablet by mouth daily.     Omega-3 Fatty Acids (FISH OIL) 1000 MG CAPS Take 1 capsule by mouth 2 (two) times daily.     omeprazole (PRILOSEC) 10 MG capsule Take 1 capsule (10 mg total) by mouth daily. 90 capsule 1   simvastatin (ZOCOR) 20 MG tablet Take 1 tablet (20 mg total) by mouth daily. 90 tablet 1   vitamin E 1000 UNIT capsule Take 1 capsule by mouth daily.     No current facility-administered medications for this visit.    Patient confirms/reports the following allergies:  No Known Allergies  No orders of the defined types were placed in this encounter.   AUTHORIZATION INFORMATION Primary Insurance: 1D#: Group #:  Secondary Insurance: 1D#: Group #:  SCHEDULE INFORMATION: Date: 06/28/22 Time: Location: [facility]

## 2022-05-13 NOTE — Addendum Note (Signed)
Addended by: Avie Arenas on: 05/13/2022 08:32 AM   Modules accepted: Orders

## 2022-05-20 DIAGNOSIS — R202 Paresthesia of skin: Secondary | ICD-10-CM | POA: Diagnosis not present

## 2022-05-20 DIAGNOSIS — R200 Anesthesia of skin: Secondary | ICD-10-CM | POA: Diagnosis not present

## 2022-05-20 DIAGNOSIS — M5136 Other intervertebral disc degeneration, lumbar region: Secondary | ICD-10-CM | POA: Diagnosis not present

## 2022-05-20 DIAGNOSIS — G610 Guillain-Barre syndrome: Secondary | ICD-10-CM | POA: Diagnosis not present

## 2022-05-22 ENCOUNTER — Other Ambulatory Visit: Payer: Self-pay | Admitting: Family Medicine

## 2022-05-22 DIAGNOSIS — K219 Gastro-esophageal reflux disease without esophagitis: Secondary | ICD-10-CM

## 2022-05-22 NOTE — Telephone Encounter (Signed)
Requested Prescriptions  Pending Prescriptions Disp Refills   omeprazole (PRILOSEC) 10 MG capsule [Pharmacy Med Name: OMEPRAZOLE DR 10 MG CAPSULE] 90 capsule 1    Sig: TAKE 1 CAPSULE BY MOUTH EVERY DAY     Gastroenterology: Proton Pump Inhibitors Passed - 05/22/2022  1:43 AM      Passed - Valid encounter within last 12 months    Recent Outpatient Visits           4 weeks ago Bilateral impacted cerumen   [place] [facility]   1 month ago Neuropathic pain of finger, right   [facility], Ocie Bob, MD   2 months ago Essential hypertension   [place] [facility], [facility]   8 months ago Essential hypertension   [place] [facility], MD   1 year ago Essential hypertension   [place] [facility], [facility]       Future Appointments             In 4 months Duanne Limerick, MD [facility] at [facility], [facility]

## 2022-05-24 ENCOUNTER — Telehealth: Payer: Self-pay | Admitting: Family Medicine

## 2022-05-24 ENCOUNTER — Other Ambulatory Visit: Payer: Self-pay | Admitting: Family Medicine

## 2022-05-24 DIAGNOSIS — K219 Gastro-esophageal reflux disease without esophagitis: Secondary | ICD-10-CM

## 2022-05-24 NOTE — Telephone Encounter (Signed)
Copied from CRM [id number]. Topic: Medicare AWV >> May 24, 2022  1:33 PM Payton Doughty wrote: Reason for CRM: Called patient to schedule Medicare Annual Wellness Visit (AWV). Left message for patient to call back and schedule Medicare Annual Wellness Visit (AWV).  Last date of AWV: 06/13/21  Please schedule an appointment at any time with Kennedy Bucker, LPN  .  If any questions, please contact me.  Thank you ,  Verlee Rossetti; Care Guide Ambulatory Clinical Support [place] [facility] Direct Dial: [phone]

## 2022-05-24 NOTE — Telephone Encounter (Signed)
Contacted Kristin Mata to schedule their annual wellness visit. Appointment made for 06/19/2022.  Verlee Rossetti; Care Guide Ambulatory Clinical Support [place] [facility] Direct Dial: [phone]

## 2022-05-24 NOTE — Telephone Encounter (Signed)
Unable to refill per protocol, Rx request is too soon. Last refill 03/21/22 for 90 and 1 refill.  Requested Prescriptions  Pending Prescriptions Disp Refills   omeprazole (PRILOSEC) 10 MG capsule [Pharmacy Med Name: OMEPRAZOLE DR 10 MG CAPSULE] 90 capsule 1    Sig: TAKE 1 CAPSULE BY MOUTH EVERY DAY     Gastroenterology: Proton Pump Inhibitors Passed - 05/24/2022  2:25 PM      Passed - Valid encounter within last 12 months    Recent Outpatient Visits           1 month ago Bilateral impacted cerumen   [place] [facility]   1 month ago Neuropathic pain of finger, right   [facility], Ocie Bob, MD   2 months ago Essential hypertension   [place] [facility], MD   8 months ago Essential hypertension   [place] [facility], MD   1 year ago Essential hypertension   [place] [facility], MD       Future Appointments             In 3 months Duanne Limerick, MD [facility] at [facility], [facility]

## 2022-05-28 ENCOUNTER — Other Ambulatory Visit: Payer: Self-pay

## 2022-05-28 ENCOUNTER — Telehealth: Payer: Self-pay | Admitting: Family Medicine

## 2022-05-28 DIAGNOSIS — K219 Gastro-esophageal reflux disease without esophagitis: Secondary | ICD-10-CM

## 2022-05-28 MED ORDER — OMEPRAZOLE 10 MG PO CPDR
10.0000 mg | DELAYED_RELEASE_CAPSULE | Freq: Every day | ORAL | 0 refills | Status: DC
Start: 2022-05-28 — End: 2022-07-10

## 2022-05-28 NOTE — Telephone Encounter (Signed)
Refill sent in. Mychart message sent.  KP

## 2022-05-28 NOTE — Telephone Encounter (Signed)
Medication Refill - Medication: omeprazole (PRILOSEC) 10 MG capsule [[id number]   Has the patient contacted their pharmacy? Yes.   (Agent: If no, request that the patient contact the pharmacy for the refill. If patient does not wish to contact the pharmacy document the reason why and proceed with request.) (Agent: If yes, when and what did the pharmacy advise?)  Preferred Pharmacy (with phone number or street name):  [id number] [place], [place] - [address] Phone: [phone]  Fax: [phone]     Has the patient been seen for an appointment in the last year OR does the patient have an upcoming appointment? Yes.    Agent: Please be advised that RX refills may take up to 3 business days. We ask that you follow-up with your pharmacy.

## 2022-06-13 DIAGNOSIS — J439 Emphysema, unspecified: Secondary | ICD-10-CM | POA: Diagnosis not present

## 2022-06-13 DIAGNOSIS — R0609 Other forms of dyspnea: Secondary | ICD-10-CM | POA: Diagnosis not present

## 2022-06-15 ENCOUNTER — Other Ambulatory Visit: Payer: Self-pay | Admitting: Family Medicine

## 2022-06-15 DIAGNOSIS — I10 Essential (primary) hypertension: Secondary | ICD-10-CM

## 2022-06-15 DIAGNOSIS — J4520 Mild intermittent asthma, uncomplicated: Secondary | ICD-10-CM

## 2022-06-15 DIAGNOSIS — E7849 Other hyperlipidemia: Secondary | ICD-10-CM

## 2022-06-15 DIAGNOSIS — J301 Allergic rhinitis due to pollen: Secondary | ICD-10-CM

## 2022-06-17 NOTE — Telephone Encounter (Signed)
Rx 03/21/22- #90 1RF- no print- as hold

## 2022-06-19 ENCOUNTER — Encounter: Payer: Self-pay | Admitting: Gastroenterology

## 2022-06-19 ENCOUNTER — Ambulatory Visit (INDEPENDENT_AMBULATORY_CARE_PROVIDER_SITE_OTHER): Payer: Medicare PPO

## 2022-06-19 VITALS — Ht <= 58 in | Wt 135.0 lb

## 2022-06-19 DIAGNOSIS — Z0000 Encounter for general adult medical examination without abnormal findings: Secondary | ICD-10-CM

## 2022-06-19 NOTE — Progress Notes (Signed)
I connected with  Kristin Mata on 06/19/22 by a audio enabled telemedicine application and verified that I am speaking with the correct person using two identifiers.  Patient Location: Home  Provider Location: Office/Clinic  I discussed the limitations of evaluation and management by telemedicine. The patient expressed understanding and agreed to proceed.  Subjective:   Kristin Mata is a 72 y.o. female who presents for Medicare Annual (Subsequent) preventive examination.  Review of Systems     Cardiac Risk Factors include: advanced age (>23men, >73 women);hypertension;dyslipidemia     Objective:    There were no vitals filed for this visit. There is no height or weight on file to calculate BMI.     06/19/2022   11:09 AM 06/11/2021   11:04 AM 06/07/2020   10:22 AM 11/15/2019    7:41 AM 10/18/2019   11:43 AM  Advanced Directives  Does Patient Have a Medical Advance Directive? No Yes Yes Yes Yes  Type of Advance Directive  Living will;Healthcare Power of [employer] Power of [place];Living will Living will;Healthcare Power of Attorney Living will  Does patient want to make changes to medical advance directive?    No - Patient declined No - Patient declined  Copy of Healthcare Power of Attorney in Chart?  No - copy requested No - copy requested Yes - validated most recent copy scanned in chart (See row information)   Would patient like information on creating a medical advance directive? No - Patient declined   No - Patient declined     Current Medications (verified) Outpatient Encounter Medications as of 06/19/2022  Medication Sig   albuterol (PROVENTIL HFA;VENTOLIN HFA) 108 (90 Base) MCG/ACT inhaler Fleming   ascorbic acid (VITAMIN C) 250 MG tablet Take 1 tablet by mouth daily.   aspirin EC 81 MG tablet Take 81 mg by mouth daily.   Biotin 10 MG TABS Take 1 tablet by mouth daily.   Cholecalciferol 25 MCG (1000 UT) tablet Take 1 tablet by mouth daily.    ferrous sulfate 325 (65 FE) MG tablet Take 325 mg by mouth daily with breakfast.   fexofenadine (ALLEGRA) 180 MG tablet Take 1 tablet (180 mg total) by mouth daily.   fluticasone (FLONASE) 50 MCG/ACT nasal spray ONE PUFF EACH NOSTRIL EVERY DAY   fluticasone-salmeterol (ADVAIR) 100-50 MCG/ACT AEPB Inhale into the lungs. Inhale 1 inhalation into the lungs every 12 (twelve) hours PATIENT NEEDS APPOINTMENT FOR ANY ADDITIONAL REFILLS   gabapentin (NEURONTIN) 300 MG capsule Take 1 capsule by mouth at bedtime. Pt taking 1 capsules QHS   losartan-hydrochlorothiazide (HYZAAR) 100-25 MG tablet TAKE 1 TABLET BY MOUTH EVERY DAY   magnesium 30 MG tablet Take 30 mg by mouth daily.   meloxicam (MOBIC) 15 MG tablet TAKE 1 TABLET (15 MG TOTAL) BY MOUTH DAILY.   montelukast (SINGULAIR) 10 MG tablet TAKE 1 TABLET BY MOUTH EVERY DAY   Multiple Vitamin (MULTIVITAMIN) tablet Take 1 tablet by mouth daily.   Omega-3 Fatty Acids (FISH OIL) 1000 MG CAPS Take 1 capsule by mouth 2 (two) times daily.   omeprazole (PRILOSEC) 10 MG capsule Take 1 capsule (10 mg total) by mouth daily.   simvastatin (ZOCOR) 20 MG tablet TAKE 1 TABLET BY MOUTH EVERY DAY   vitamin E 1000 UNIT capsule Take 1 capsule by mouth daily.   No facility-administered encounter medications on file as of 06/19/2022.    Allergies (verified) Patient has no known allergies.   History: Past Medical History:  Diagnosis Date  Arthritis    Asthma    Colon cancer (HCC) 2015   had chemo   Degenerative disc disease, lumbar    Hyperlipidemia    Hypertension    Neuropathy    hands and feet.  S/P chemo.   Personal history of chemotherapy    RLS (restless legs syndrome)    Past Surgical History:  Procedure Laterality Date   ABDOMINAL HYSTERECTOMY     BREAST CYST ASPIRATION Bilateral    neg   CATARACT EXTRACTION W/PHACO Right 10/18/2019   Procedure: CATARACT EXTRACTION PHACO AND INTRAOCULAR LENS PLACEMENT (IOC) RIGHT 2.35  00:26.5;  Surgeon: Nevada Crane, MD;  Location[facility];  Service: Ophthalmology;  Laterality: Right;   CATARACT EXTRACTION W/PHACO Left 11/15/2019   Procedure: CATARACT EXTRACTION PHACO AND INTRAOCULAR LENS PLACEMENT (IOC) LEFT;  Surgeon: Nevada Crane, MD;  Location: [facility];  Service: Ophthalmology;  Laterality: Left;  1.50 0:19.1   COLON RESECTION     HAMMER TOE SURGERY     HAND SURGERY     HAND SURGERY     Thumb   ROTATOR CUFF REPAIR Right    Family History  Problem Relation Age of Onset   Breast cancer Sister 62   Social History   Socioeconomic History   Marital status: Married    Spouse name: Not on file   Number of children: 4   Years of education: Not on file   Highest education level: Associate degree: occupational, Scientist, product/process development, or vocational program  Occupational History   Not on file  Tobacco Use   Smoking status: Never   Smokeless tobacco: Never  Vaping Use   Vaping Use: Never used  Substance and Sexual Activity   Alcohol use: Yes    Alcohol/week: 1.0 standard drink of alcohol    Types: 1 Standard drinks or equivalent per week   Drug use: No   Sexual activity: Not Currently  Other Topics Concern   Not on file  Social History Narrative   Not on file   Social Determinants of Health   Financial Resource Strain: Low Risk  (06/19/2022)   Overall Financial Resource Strain (CARDIA)    Difficulty of Paying Living Expenses: Not hard at all  Food Insecurity: No Food Insecurity (06/19/2022)   Hunger Vital Sign    Worried About Running Out of Food in the Last Year: Never true    Ran Out of Food in the Last Year: Never true  Transportation Needs: No Transportation Needs (06/19/2022)   PRAPARE - Administrator, Civil Service (Medical): No    Lack of Transportation (Non-Medical): No  Physical Activity: Insufficiently Active (06/19/2022)   Exercise Vital Sign    Days of Exercise per Week: 7 days    Minutes of Exercise per Session: 20 min  Stress:  No Stress Concern Present (06/19/2022)   [employer] of Occupational Health - Occupational Stress Questionnaire    Feeling of Stress : Only a little  Social Connections: Socially Integrated (06/19/2022)   Social Connection and Isolation Panel [NHANES]    Frequency of Communication with Friends and Family: More than three times a week    Frequency of Social Gatherings with Friends and Family: More than three times a week    Attends Religious Services: More than 4 times per year    Active Member of [employer] or Organizations: Yes    Attends Engineer, structural: More than 4 times per year    Marital Status: Married  Tobacco Counseling Counseling given: Not Answered   Clinical Intake:  Pre-visit preparation completed: Yes  Pain : No/denies pain     Nutritional Risks: None Diabetes: No  How often do you need to have someone help you when you read instructions, pamphlets, or other written materials from your doctor or pharmacy?: 1 - Never  Diabetic?no  Interpreter Needed?: No  Information entered by :: Kennedy Bucker, LPN   Activities of Daily Living    06/19/2022   11:10 AM  In your present state of health, do you have any difficulty performing the following activities:  Hearing? 0  Vision? 0  Difficulty concentrating or making decisions? 0  Walking or climbing stairs? 0  Dressing or bathing? 0  Doing errands, shopping? 0  Preparing Food and eating ? N  Using the Toilet? N  In the past six months, have you accidently leaked urine? N  Do you have problems with loss of bowel control? N  Managing your Medications? N  Managing your Finances? N  Housekeeping or managing your Housekeeping? N    Patient Care Team: Duanne Limerick, MD as PCP - General (Family Medicine)  Indicate any recent Medical Services you may have received from other than [facility] providers in the past year (date may be approximate).     Assessment:   This is a routine wellness  examination for Kristin Mata.  Hearing/Vision screen Hearing Screening - Comments:: No aids Vision Screening - Comments:: Readers- Dr.King  Dietary issues and exercise activities discussed: Current Exercise Habits: Home exercise routine, Type of exercise: walking, Time (Minutes): 20, Frequency (Times/Week): 7, Weekly Exercise (Minutes/Week): 140, Intensity: Mild   Goals Addressed             This Visit's Progress    DIET - EAT MORE FRUITS AND VEGETABLES         Depression Screen    06/19/2022   11:07 AM 04/23/2022    3:27 PM 03/21/2022    9:56 AM 09/18/2021    9:03 AM 06/11/2021   11:03 AM 03/21/2021    9:31 AM 06/07/2020   10:21 AM  PHQ 2/9 Scores  PHQ - 2 Score 0 0 0 2 0 0 0  PHQ- 9 Score 0 0 0 5  0     Fall Risk    06/19/2022   11:10 AM 04/23/2022    3:27 PM 03/21/2022    9:56 AM 09/18/2021    9:03 AM 06/11/2021   11:04 AM  Fall Risk   Falls in the past year? 0 0 0 1 0  Number falls in past yr: 0 0 0 0 0  Injury with Fall? 0 0 0 0 0  Risk for fall due to : No Fall Risks No Fall Risks No Fall Risks History of fall(s) No Fall Risks  Follow up Falls prevention discussed;Falls evaluation completed Falls evaluation completed Falls evaluation completed Falls prevention discussed Falls prevention discussed    FALL RISK PREVENTION PERTAINING TO THE HOME:  Any stairs in or around the home? No  If so, are there any without handrails? No  Home free of loose throw rugs in walkways, pet beds, electrical cords, etc? Yes  Adequate lighting in your home to reduce risk of falls? Yes   ASSISTIVE DEVICES UTILIZED TO PREVENT FALLS:  Life alert? No  Use of a cane, walker or w/c? No  Grab bars in the bathroom? No  Shower chair or bench in shower? Yes  Elevated toilet seat or a handicapped  toilet? No    Cognitive Function:        06/19/2022   11:14 AM  6CIT Screen  What Year? 0 points  What month? 0 points  What time? 0 points  Count back from 20 0 points  Months in reverse 0  points  Repeat phrase 2 points  Total Score 2 points    Immunizations Immunization History  Administered Date(s) Administered   PFIZER(Purple Top)SARS-COV-2 Vaccination 01/20/2019, 02/10/2019, 11/05/2019, 08/25/2020   Pneumococcal Conjugate-13 09/29/2018   Pneumococcal Polysaccharide-23 06/07/2020   Tdap 01/05/2016   Zoster Recombinat (Shingrix) 11/19/2018, 01/26/2019    TDAP status: Up to date  Flu Vaccine status: Declined, Education has been provided regarding the importance of this vaccine but patient still declined. Advised may receive this vaccine at local pharmacy or Health Dept. Aware to provide a copy of the vaccination record if obtained from local pharmacy or Health Dept. Verbalized acceptance and understanding.  Pneumococcal vaccine status: Up to date  Covid-19 vaccine status: Completed vaccines  Qualifies for Shingles Vaccine? Yes   Zostavax completed No   Shingrix Completed?: Yes  Screening Tests Health Maintenance  Topic Date Due   COLONOSCOPY (Pts 45-80yrs Insurance coverage will need to be confirmed)  06/09/2020   COVID-19 Vaccine (5 - 2023-24 season) 09/28/2021   Hepatitis C Screening  04/23/2023 (Originally 10/21/1968)   INFLUENZA VACCINE  08/29/2022   MAMMOGRAM  02/26/2023   Medicare Annual Wellness (AWV)  06/19/2023   DTaP/Tdap/Td (2 - Td or Tdap) 01/04/2026   Pneumonia Vaccine 10+ Years old  Completed   DEXA SCAN  Completed   Zoster Vaccines- Shingrix  Completed   HPV VACCINES  Aged Out    Health Maintenance  Health Maintenance Due  Topic Date Due   COLONOSCOPY (Pts 45-30yrs Insurance coverage will need to be confirmed)  06/09/2020   COVID-19 Vaccine (5 - 2023-24 season) 09/28/2021    Has colonoscopy scheduled for 06/28/22  Mammogram status: Completed 02/25/22. Repeat every year  Bone Density status: Completed 07/25/20. Results reflect: Bone density results: NORMAL. Repeat every 5 years.  Lung Cancer Screening: (Low Dose CT Chest recommended  if Age 32-80 years, 30 pack-year currently smoking OR have quit w/in 15years.) does not qualify.   Additional Screening:  Hepatitis C Screening: does qualify; Completed no  Vision Screening: Recommended annual ophthalmology exams for early detection of glaucoma and other disorders of the eye. Is the patient up to date with their annual eye exam?  Yes  Who is the provider or what is the name of the office in which the patient attends annual eye exams? Dr.King If pt is not established with a provider, would they like to be referred to a provider to establish care? No .   Dental Screening: Recommended annual dental exams for proper oral hygiene  Community Resource Referral / Chronic Care Management: CRR required this visit?  No   CCM required this visit?  No      Plan:     I have personally reviewed and noted the following in the patient's chart:   Medical and social history Use of alcohol, tobacco or illicit drugs  Current medications and supplements including opioid prescriptions. Patient is not currently taking opioid prescriptions. Functional ability and status Nutritional status Physical activity Advanced directives List of other physicians Hospitalizations, surgeries, and ER visits in previous 12 months Vitals Screenings to include cognitive, depression, and falls Referrals and appointments  In addition, I have reviewed and discussed with patient certain preventive protocols, quality  metrics, and best practice recommendations. A written personalized care plan for preventive services as well as general preventive health recommendations were provided to patient.     Hal Hope, LPN   1/61/0960   Nurse Notes: none

## 2022-06-19 NOTE — Patient Instructions (Signed)
Kristin Mata , Thank you for taking time to come for your Medicare Wellness Visit. I appreciate your ongoing commitment to your health goals. Please review the following plan we discussed and let me know if I can assist you in the future.   These are the goals we discussed:  Goals      DIET - EAT MORE FRUITS AND VEGETABLES     Weight (lb) < 130 lb (59 kg)     Pt states she would like to lose weight over the next year with healthy eating and physical activity         This is a list of the screening recommended for you and due dates:  Health Maintenance  Topic Date Due   Colon Cancer Screening  06/09/2020   COVID-19 Vaccine (5 - 2023-24 season) 09/28/2021   Hepatitis C Screening: USPSTF Recommendation to screen - Ages 18-79 yo.  04/23/2023*   Flu Shot  08/29/2022   Mammogram  02/26/2023   Medicare Annual Wellness Visit  06/19/2023   DTaP/Tdap/Td vaccine (2 - Td or Tdap) 01/04/2026   Pneumonia Vaccine  Completed   DEXA scan (bone density measurement)  Completed   Zoster (Shingles) Vaccine  Completed   HPV Vaccine  Aged Out  *Topic was postponed. The date shown is not the original due date.    Advanced directives: no  Conditions/risks identified: none  Next appointment: Follow up in one year for your annual wellness visit 06/25/23 @ 10:45 am by phone   Preventive Care 65 Years and Older, Female Preventive care refers to lifestyle choices and visits with your health care provider that can promote health and wellness. What does preventive care include? A yearly physical exam. This is also called an annual well check. Dental exams once or twice a year. Routine eye exams. Ask your health care provider how often you should have your eyes checked. Personal lifestyle choices, including: Daily care of your teeth and gums. Regular physical activity. Eating a healthy diet. Avoiding tobacco and drug use. Limiting alcohol use. Practicing safe sex. Taking low-dose aspirin every  day. Taking vitamin and mineral supplements as recommended by your health care provider. What happens during an annual well check? The services and screenings done by your health care provider during your annual well check will depend on your age, overall health, lifestyle risk factors, and family history of disease. Counseling  Your health care provider may ask you questions about your: Alcohol use. Tobacco use. Drug use. Emotional well-being. Home and relationship well-being. Sexual activity. Eating habits. History of falls. Memory and ability to understand (cognition). Work and work Astronomer. Reproductive health. Screening  You may have the following tests or measurements: Height, weight, and BMI. Blood pressure. Lipid and cholesterol levels. These may be checked every 5 years, or more frequently if you are over 16 years old. Skin check. Lung cancer screening. You may have this screening every year starting at age 57 if you have a 30-pack-year history of smoking and currently smoke or have quit within the past 15 years. Fecal occult blood test (FOBT) of the stool. You may have this test every year starting at age 1. Flexible sigmoidoscopy or colonoscopy. You may have a sigmoidoscopy every 5 years or a colonoscopy every 10 years starting at age 29. Hepatitis C blood test. Hepatitis B blood test. Sexually transmitted disease (STD) testing. Diabetes screening. This is done by checking your blood sugar (glucose) after you have not eaten for a while (fasting). You may  have this done every 1-3 years. Bone density scan. This is done to screen for osteoporosis. You may have this done starting at age 19. Mammogram. This may be done every 1-2 years. Talk to your health care provider about how often you should have regular mammograms. Talk with your health care provider about your test results, treatment options, and if necessary, the need for more tests. Vaccines  Your health care  provider may recommend certain vaccines, such as: Influenza vaccine. This is recommended every year. Tetanus, diphtheria, and acellular pertussis (Tdap, Td) vaccine. You may need a Td booster every 10 years. Zoster vaccine. You may need this after age 66. Pneumococcal 13-valent conjugate (PCV13) vaccine. One dose is recommended after age 30. Pneumococcal polysaccharide (PPSV23) vaccine. One dose is recommended after age 33. Talk to your health care provider about which screenings and vaccines you need and how often you need them. This information is not intended to replace advice given to you by your health care provider. Make sure you discuss any questions you have with your health care provider. Document Released: 02/10/2015 Document Revised: 10/04/2015 Document Reviewed: 11/15/2014 Elsevier Interactive Patient Education  2017 [employer].  Fall Prevention in the Home Falls can cause injuries. They can happen to people of all ages. There are many things you can do to make your home safe and to help prevent falls. What can I do on the outside of my home? Regularly fix the edges of walkways and driveways and fix any cracks. Remove anything that might make you trip as you walk through a door, such as a raised step or threshold. Trim any bushes or trees on the path to your home. Use bright outdoor lighting. Clear any walking paths of anything that might make someone trip, such as rocks or tools. Regularly check to see if handrails are loose or broken. Make sure that both sides of any steps have handrails. Any raised decks and porches should have guardrails on the edges. Have any leaves, snow, or ice cleared regularly. Use sand or salt on walking paths during winter. Clean up any spills in your garage right away. This includes oil or grease spills. What can I do in the bathroom? Use night lights. Install grab bars by the toilet and in the tub and shower. Do not use towel bars as grab  bars. Use non-skid mats or decals in the tub or shower. If you need to sit down in the shower, use a plastic, non-slip stool. Keep the floor dry. Clean up any water that spills on the floor as soon as it happens. Remove soap buildup in the tub or shower regularly. Attach bath mats securely with double-sided non-slip rug tape. Do not have throw rugs and other things on the floor that can make you trip. What can I do in the bedroom? Use night lights. Make sure that you have a light by your bed that is easy to reach. Do not use any sheets or blankets that are too big for your bed. They should not hang down onto the floor. Have a firm chair that has side arms. You can use this for support while you get dressed. Do not have throw rugs and other things on the floor that can make you trip. What can I do in the kitchen? Clean up any spills right away. Avoid walking on wet floors. Keep items that you use a lot in easy-to-reach places. If you need to reach something above you, use a strong step  stool that has a grab bar. Keep electrical cords out of the way. Do not use floor polish or wax that makes floors slippery. If you must use wax, use non-skid floor wax. Do not have throw rugs and other things on the floor that can make you trip. What can I do with my stairs? Do not leave any items on the stairs. Make sure that there are handrails on both sides of the stairs and use them. Fix handrails that are broken or loose. Make sure that handrails are as long as the stairways. Check any carpeting to make sure that it is firmly attached to the stairs. Fix any carpet that is loose or worn. Avoid having throw rugs at the top or bottom of the stairs. If you do have throw rugs, attach them to the floor with carpet tape. Make sure that you have a light switch at the top of the stairs and the bottom of the stairs. If you do not have them, ask someone to add them for you. What else can I do to help prevent  falls? Wear shoes that: Do not have high heels. Have rubber bottoms. Are comfortable and fit you well. Are closed at the toe. Do not wear sandals. If you use a stepladder: Make sure that it is fully opened. Do not climb a closed stepladder. Make sure that both sides of the stepladder are locked into place. Ask someone to hold it for you, if possible. Clearly mark and make sure that you can see: Any grab bars or handrails. First and last steps. Where the edge of each step is. Use tools that help you move around (mobility aids) if they are needed. These include: Canes. Walkers. Scooters. Crutches. Turn on the lights when you go into a dark area. Replace any light bulbs as soon as they burn out. Set up your furniture so you have a clear path. Avoid moving your furniture around. If any of your floors are uneven, fix them. If there are any pets around you, be aware of where they are. Review your medicines with your doctor. Some medicines can make you feel dizzy. This can increase your chance of falling. Ask your doctor what other things that you can do to help prevent falls. This information is not intended to replace advice given to you by your health care provider. Make sure you discuss any questions you have with your health care provider. Document Released: 11/10/2008 Document Revised: 06/22/2015 Document Reviewed: 02/18/2014 Elsevier Interactive Patient Education  2017 [employer].

## 2022-06-20 ENCOUNTER — Encounter: Payer: Self-pay | Admitting: Gastroenterology

## 2022-06-20 NOTE — Anesthesia Preprocedure Evaluation (Addendum)
Anesthesia Evaluation  Patient identified by MRN, date of birth, ID band Patient awake    Reviewed: Allergy & Precautions, H&P , NPO status , Patient's Chart, lab work & pertinent test results  Airway Mallampati: III  TM Distance: <3 FB Neck ROM: Full   Comment:   Dental no notable dental hx.    Pulmonary asthma    Pulmonary exam normal breath sounds clear to auscultation       Cardiovascular hypertension, Normal cardiovascular exam Rhythm:Regular Rate:Normal  Echo [id number] EF 65-70%, mild MR, mild TR   Neuro/Psych  PSYCHIATRIC DISORDERS     Dementia  Neuromuscular disease    GI/Hepatic negative GI ROS, Neg liver ROS,GERD  ,,  Endo/Other  negative endocrine ROS    Renal/GU negative Renal ROS  negative genitourinary   Musculoskeletal  (+) Arthritis ,    Abdominal   Peds negative pediatric ROS (+)  Hematology  (+) Blood dyscrasia, anemia   Anesthesia Other Findings Hypertension  Hyperlipidemia Personal history of chemotherapy  Colon cancer (HCC) Asthma  Arthritis Neuropathy  Degenerative disc disease, lumbar RLS (restless legs syndrome)  Mild Mitral regurg Mild Triscupid regurg Dementia (HCC)    Reproductive/Obstetrics negative OB ROS                             Anesthesia Physical Anesthesia Plan  ASA: 3  Anesthesia Plan: General   Post-op Pain Management:    Induction: Intravenous  PONV Risk Score and Plan:   Airway Management Planned: Natural Airway and Nasal Cannula  Additional Equipment:   Intra-op Plan:   Post-operative Plan:   Informed Consent: I have reviewed the patients History and Physical, chart, labs and discussed the procedure including the risks, benefits and alternatives for the proposed anesthesia with the patient or authorized representative who has indicated his/her understanding and acceptance.     Dental Advisory Given  Plan Discussed with:  Anesthesiologist, CRNA and Surgeon  Anesthesia Plan Comments: (Patient consented for risks of anesthesia including but not limited to:  - adverse reactions to medications - risk of airway placement if required - damage to eyes, teeth, lips or other oral mucosa - nerve damage due to positioning  - sore throat or hoarseness - Damage to heart, brain, nerves, lungs, other parts of body or loss of life  Patient voiced understanding.)        Anesthesia Quick Evaluation

## 2022-06-28 ENCOUNTER — Encounter
Admission: RE | Disposition: A | Discharge status: Home / Self Care | Payer: Self-pay | Source: Home / Self Care | Attending: Gastroenterology

## 2022-06-28 ENCOUNTER — Other Ambulatory Visit: Payer: Self-pay

## 2022-06-28 ENCOUNTER — Ambulatory Visit: Payer: Medicare PPO | Admitting: Anesthesiology

## 2022-06-28 ENCOUNTER — Encounter: Payer: Self-pay | Admitting: Gastroenterology

## 2022-06-28 ENCOUNTER — Ambulatory Visit
Admission: RE | Admit: 2022-06-28 | Discharge: 2022-06-28 | Disposition: A | Discharge status: Home / Self Care | Payer: Medicare PPO | Attending: Gastroenterology | Admitting: Gastroenterology

## 2022-06-28 DIAGNOSIS — D649 Anemia, unspecified: Secondary | ICD-10-CM | POA: Insufficient documentation

## 2022-06-28 DIAGNOSIS — K219 Gastro-esophageal reflux disease without esophagitis: Secondary | ICD-10-CM | POA: Insufficient documentation

## 2022-06-28 DIAGNOSIS — Z85038 Personal history of other malignant neoplasm of large intestine: Secondary | ICD-10-CM | POA: Diagnosis not present

## 2022-06-28 DIAGNOSIS — D759 Disease of blood and blood-forming organs, unspecified: Secondary | ICD-10-CM | POA: Diagnosis not present

## 2022-06-28 DIAGNOSIS — Z1211 Encounter for screening for malignant neoplasm of colon: Secondary | ICD-10-CM

## 2022-06-28 DIAGNOSIS — K633 Ulcer of intestine: Secondary | ICD-10-CM | POA: Diagnosis not present

## 2022-06-28 DIAGNOSIS — D125 Benign neoplasm of sigmoid colon: Secondary | ICD-10-CM | POA: Diagnosis not present

## 2022-06-28 DIAGNOSIS — F0390 Unspecified dementia without behavioral disturbance: Secondary | ICD-10-CM | POA: Diagnosis not present

## 2022-06-28 DIAGNOSIS — I10 Essential (primary) hypertension: Secondary | ICD-10-CM | POA: Diagnosis not present

## 2022-06-28 DIAGNOSIS — K5000 Crohn's disease of small intestine without complications: Secondary | ICD-10-CM | POA: Diagnosis not present

## 2022-06-28 DIAGNOSIS — K635 Polyp of colon: Secondary | ICD-10-CM

## 2022-06-28 DIAGNOSIS — Z09 Encounter for follow-up examination after completed treatment for conditions other than malignant neoplasm: Secondary | ICD-10-CM | POA: Diagnosis not present

## 2022-06-28 HISTORY — DX: Nonrheumatic mitral (valve) insufficiency: I34.0

## 2022-06-28 HISTORY — PX: POLYPECTOMY: SHX5525

## 2022-06-28 HISTORY — DX: Rheumatic tricuspid insufficiency: I07.1

## 2022-06-28 HISTORY — DX: Unspecified dementia, unspecified severity, without behavioral disturbance, psychotic disturbance, mood disturbance, and anxiety: F03.90

## 2022-06-28 HISTORY — PX: COLONOSCOPY WITH PROPOFOL: SHX5780

## 2022-06-28 LAB — HM COLONOSCOPY

## 2022-06-28 SURGERY — COLONOSCOPY WITH PROPOFOL
Anesthesia: General | Site: Rectum

## 2022-06-28 MED ORDER — SODIUM CHLORIDE 0.9 % IV SOLN: INTRAVENOUS | Status: DC

## 2022-06-28 MED ORDER — PROPOFOL 10 MG/ML IV BOLUS
INTRAVENOUS | Status: DC | PRN
  Administered 2022-06-28: 40 mg via INTRAVENOUS
  Administered 2022-06-28 (×2): 20 mg via INTRAVENOUS
  Administered 2022-06-28: 40 mg via INTRAVENOUS
  Administered 2022-06-28: 80 mg via INTRAVENOUS

## 2022-06-28 MED ORDER — STERILE WATER FOR IRRIGATION IR SOLN
Status: DC | PRN
  Administered 2022-06-28: 1

## 2022-06-28 MED ORDER — LIDOCAINE HCL (CARDIAC) PF 100 MG/5ML IV SOSY
PREFILLED_SYRINGE | INTRAVENOUS | Status: DC | PRN
  Administered 2022-06-28: 50 mg via INTRAVENOUS

## 2022-06-28 MED ORDER — LACTATED RINGERS IV SOLN: INTRAVENOUS | Status: DC

## 2022-06-28 SURGICAL SUPPLY — 21 items

## 2022-06-28 NOTE — Anesthesia Postprocedure Evaluation (Signed)
Anesthesia Post Note  Patient: Kristin Mata  Procedure(s) Performed: COLONOSCOPY WITH PROPOFOL (Rectum) POLYPECTOMY (Rectum)  Patient location during evaluation: PACU Anesthesia Type: General Level of consciousness: awake and alert Pain management: pain level controlled Vital Signs Assessment: post-procedure vital signs reviewed and stable Respiratory status: spontaneous breathing, nonlabored ventilation, respiratory function stable and patient connected to nasal cannula oxygen Cardiovascular status: blood pressure returned to baseline and stable Postop Assessment: no apparent nausea or vomiting Anesthetic complications: no   No notable events documented.   Last Vitals:  Vitals:   06/28/22 0934 06/28/22 0944  BP: (!) 100/43 118/73  Pulse: 78 85  Resp: (!) 22 (!) 24  Temp: 36.5 C (!) 36.4 C  SpO2: 97% 97%    Last Pain:  Vitals:   06/28/22 0944  TempSrc:   PainSc: 0-No pain                 Dayana Dalporto C Nealie Mchatton

## 2022-06-28 NOTE — Op Note (Addendum)
[facility] Patient Name: Kristin Mata Procedure Date: 06/28/2022 8:51 AM MRN: [id number] Account #: [id number] Date of Birth: 1950/11/10 Admit Type: Outpatient Age: 72 Room: [facility] OR ROOM 01 Gender: Female Note Status: Finalized Instrument Name: [id number] Procedure:             Colonoscopy Indications:           High risk colon cancer surveillance: Personal history                         of colon cancer Providers:             Midge Minium MD, MD Referring MD:          Duanne Limerick, MD (Referring MD) Medicines:             Propofol per Anesthesia Complications:         No immediate complications. Procedure:             Pre-Anesthesia Assessment:                        - Prior to the procedure, a History and Physical was                         performed, and patient medications and allergies were                         reviewed. The patient's tolerance of previous                         anesthesia was also reviewed. The risks and benefits                         of the procedure and the sedation options and risks                         were discussed with the patient. All questions were                         answered, and informed consent was obtained. Prior                         Anticoagulants: The patient has taken no anticoagulant                         or antiplatelet agents. ASA Grade Assessment: II - A                         patient with mild systemic disease. After reviewing                         the risks and benefits, the patient was deemed in                         satisfactory condition to undergo the procedure.                        After obtaining informed consent, the colonoscope was  passed under direct vision. Throughout the procedure,                         the patient's blood pressure, pulse, and oxygen                         saturations were monitored continuously. The was                          introduced through the anus and advanced to the the                         ileocolonic anastomosis. The colonoscopy was performed                         without difficulty. The patient tolerated the                         procedure well. The quality of the bowel preparation                         was excellent. Findings:      The perianal and digital rectal examinations were normal.      A 3 mm polyp was found in the sigmoid colon. The polyp was sessile. The       polyp was removed with a cold biopsy forceps. Resection and retrieval       were complete.      Localized inflammation characterized by aphthous ulcerations was found       in the terminal ileum. Biopsies were taken with a cold forceps for       histology. Impression:            - One 3 mm polyp in the sigmoid colon, removed with a                         cold biopsy forceps. Resected and retrieved.                        - Inflammation was found in the ileum. Biopsied. Recommendation:        - Discharge patient to home.                        - Resume previous diet.                        - Continue present medications.                        - Await pathology results.                        - Repeat colonoscopy in 5 years for surveillance. Procedure Code(s):     --- Professional ---                        [phone], Colonoscopy, flexible; with biopsy, single or                         multiple Diagnosis Code(s):     --- Professional ---  Z85.038, Personal history of other malignant neoplasm                         of large intestine                        D12.5, Benign neoplasm of sigmoid colon CPT copyright 2022 American Medical Association. All rights reserved. The codes documented in this report are preliminary and upon coder review may  be revised to meet current compliance requirements. Midge Minium MD, MD 06/28/2022 9:35:28 AM This report has been signed electronically. Number of Addenda:  0 Note Initiated On: 06/28/2022 8:51 AM Scope Withdrawal Time: 0 hours 9 minutes 13 seconds  Total Procedure Duration: 0 hours 13 minutes 51 seconds  Estimated Blood Loss:  Estimated blood loss: none.      [facility]

## 2022-06-28 NOTE — Transfer of Care (Signed)
Immediate Anesthesia Transfer of Care Note  Patient: Kristin Mata  Procedure(s) Performed: COLONOSCOPY WITH PROPOFOL (Rectum) POLYPECTOMY (Rectum)  Patient Location: PACU  Anesthesia Type: General  Level of Consciousness: awake, alert  and patient cooperative  Airway and Oxygen Therapy: Patient Spontanous Breathing and Patient connected to supplemental oxygen  Post-op Assessment: Post-op Vital signs reviewed, Patient's Cardiovascular Status Stable, Respiratory Function Stable, Patent Airway and No signs of Nausea or vomiting  Post-op Vital Signs: Reviewed and stable  Complications: No notable events documented.

## 2022-06-28 NOTE — H&P (Signed)
Midge Minium, MD [facility] [address], [address] [place], [place] [address] [phone] Fax : [phone]  Primary Care Physician:  Duanne Limerick, MD Primary Gastroenterologist:  Dr. Servando Snare  Pre-Procedure History & Physical: HPI:  Kristin Mata is a 72 y.o. female is here for an colonoscopy.   Past Medical History:  Diagnosis Date   Arthritis    Asthma    Colon cancer (HCC) 2015   had chemo   Degenerative disc disease, lumbar    Dementia (HCC)    Hyperlipidemia    Hypertension    Mild mitral regurgitation by prior echocardiogram    Mild tricuspid regurgitation by prior echocardiogram    Neuropathy    hands and feet.  S/P chemo.   Personal history of chemotherapy    RLS (restless legs syndrome)     Past Surgical History:  Procedure Laterality Date   ABDOMINAL HYSTERECTOMY     BREAST CYST ASPIRATION Bilateral    neg   CATARACT EXTRACTION W/PHACO Right 10/18/2019   Procedure: CATARACT EXTRACTION PHACO AND INTRAOCULAR LENS PLACEMENT (IOC) RIGHT 2.35  00:26.5;  Surgeon: Nevada Crane, MD;  Location: [facility];  Service: Ophthalmology;  Laterality: Right;   CATARACT EXTRACTION W/PHACO Left 11/15/2019   Procedure: CATARACT EXTRACTION PHACO AND INTRAOCULAR LENS PLACEMENT (IOC) LEFT;  Surgeon: Nevada Crane, MD;  Location: [facility];  Service: Ophthalmology;  Laterality: Left;  1.50 0:19.1   COLON RESECTION     HAMMER TOE SURGERY     HAND SURGERY     HAND SURGERY     Thumb   ROTATOR CUFF REPAIR Right     Prior to Admission medications   Medication Sig Start Date End Date Taking? Authorizing Provider  albuterol (PROVENTIL HFA;VENTOLIN HFA) 108 (90 Base) MCG/ACT inhaler Meredeth Ide 04/04/15  Yes [provider]  ascorbic acid (VITAMIN C) 250 MG tablet Take 1 tablet by mouth daily.   Yes [provider]  aspirin EC 81 MG tablet Take 81 mg by mouth daily.   Yes [provider]  Biotin 10 MG TABS Take 1 tablet by mouth  daily.   Yes [provider]  Cholecalciferol 25 MCG (1000 UT) tablet Take 1 tablet by mouth daily.   Yes [provider]  ferrous sulfate 325 (65 FE) MG tablet Take 325 mg by mouth daily with breakfast.   Yes [provider]  fexofenadine (ALLEGRA) 180 MG tablet Take 1 tablet (180 mg total) by mouth daily. 03/21/22  Yes Duanne Limerick, MD  fluticasone (FLONASE) 50 MCG/ACT nasal spray ONE PUFF EACH NOSTRIL EVERY DAY 05/07/22  Yes Duanne Limerick, MD  fluticasone-salmeterol (ADVAIR) 100-50 MCG/ACT AEPB Inhale into the lungs. Inhale 1 inhalation into the lungs every 12 (twelve) hours PATIENT NEEDS APPOINTMENT FOR ANY ADDITIONAL REFILLS 06/13/20  Yes [provider]  gabapentin (NEURONTIN) 300 MG capsule Take 1 capsule by mouth at bedtime. Pt taking 1 capsules QHS 05/11/21  Yes [provider]  losartan-hydrochlorothiazide (HYZAAR) 100-25 MG tablet TAKE 1 TABLET BY MOUTH EVERY DAY 06/17/22  Yes Duanne Limerick, MD  magnesium 30 MG tablet Take 30 mg by mouth daily.   Yes [provider]  meloxicam (MOBIC) 15 MG tablet TAKE 1 TABLET (15 MG TOTAL) BY MOUTH DAILY. 05/07/22  Yes Duanne Limerick, MD  montelukast (SINGULAIR) 10 MG tablet TAKE 1 TABLET BY MOUTH EVERY DAY 06/17/22  Yes Duanne Limerick, MD  Multiple Vitamin (MULTIVITAMIN) tablet Take 1 tablet by mouth  daily.   Yes [provider]  Omega-3 Fatty Acids (FISH OIL) 1000 MG CAPS Take 1 capsule by mouth 2 (two) times daily.   Yes [provider]  omeprazole (PRILOSEC) 10 MG capsule Take 1 capsule (10 mg total) by mouth daily. 05/28/22  Yes Duanne Limerick, MD  simvastatin (ZOCOR) 20 MG tablet TAKE 1 TABLET BY MOUTH EVERY DAY 06/17/22  Yes Duanne Limerick, MD  vitamin E 1000 UNIT capsule Take 1 capsule by mouth daily.   Yes [provider]    Allergies as of 05/13/2022   (No Known Allergies)    Family History  Problem Relation Age of Onset   Breast cancer Sister 45     Social History   Socioeconomic History   Marital status: Married    Spouse name: Not on file   Number of children: 4   Years of education: Not on file   Highest education level: Associate degree: occupational, Scientist, product/process development, or vocational program  Occupational History   Not on file  Tobacco Use   Smoking status: Never   Smokeless tobacco: Never  Vaping Use   Vaping Use: Never used  Substance and Sexual Activity   Alcohol use: Yes    Alcohol/week: 1.0 standard drink of alcohol    Types: 1 Standard drinks or equivalent per week   Drug use: No   Sexual activity: Not Currently  Other Topics Concern   Not on file  Social History Narrative   Not on file   Social Determinants of Health   Financial Resource Strain: Low Risk  (06/19/2022)   Overall Financial Resource Strain (CARDIA)    Difficulty of Paying Living Expenses: Not hard at all  Food Insecurity: No Food Insecurity (06/19/2022)   Hunger Vital Sign    Worried About Running Out of Food in the Last Year: Never true    Ran Out of Food in the Last Year: Never true  Transportation Needs: No Transportation Needs (06/19/2022)   PRAPARE - Administrator, Civil Service (Medical): No    Lack of Transportation (Non-Medical): No  Physical Activity: Insufficiently Active (06/19/2022)   Exercise Vital Sign    Days of Exercise per Week: 7 days    Minutes of Exercise per Session: 20 min  Stress: No Stress Concern Present (06/19/2022)   [employer] of Occupational Health - Occupational Stress Questionnaire    Feeling of Stress : Only a little  Social Connections: Socially Integrated (06/19/2022)   Social Connection and Isolation Panel [NHANES]    Frequency of Communication with Friends and Family: More than three times a week    Frequency of Social Gatherings with Friends and Family: More than three times a week    Attends Religious Services: More than 4 times per year    Active Member of [employer] or Organizations: Yes     Attends Engineer, structural: More than 4 times per year    Marital Status: Married  Catering manager Violence: Not At Risk (06/19/2022)   Humiliation, Afraid, Rape, and Kick questionnaire    Fear of Current or Ex-Partner: No    Emotionally Abused: No    Physically Abused: No    Sexually Abused: No    Review of Systems: See HPI, otherwise negative ROS  Physical Exam: BP 127/81   Pulse 71   Temp 97.9 F (36.6 C) (Temporal)   Resp (!) 22   Ht 4\' 10"  (1.473 m)   Wt 58.8 kg   SpO2  98%   BMI 27.09 kg/m  General:   Alert,  pleasant and cooperative in NAD Head:  Normocephalic and atraumatic. Neck:  Supple; no masses or thyromegaly. Lungs:  Clear throughout to auscultation.    Heart:  Regular rate and rhythm. Abdomen:  Soft, nontender and nondistended. Normal bowel sounds, without guarding, and without rebound.   Neurologic:  Alert and  oriented x4;  grossly normal neurologically.  Impression/Plan: Kristin Mata is here for an colonoscopy to be performed for hisotry of colon cancer. Last colonoscopy in 2019  Risks, benefits, limitations, and alternatives regarding  colonoscopy have been reviewed with the patient.  Questions have been answered.  All parties agreeable.   Midge Minium, MD  06/28/2022, 8:49 AM

## 2022-07-01 ENCOUNTER — Encounter: Payer: Self-pay | Admitting: Gastroenterology

## 2022-07-05 ENCOUNTER — Other Ambulatory Visit: Payer: Self-pay

## 2022-07-07 ENCOUNTER — Encounter: Payer: Self-pay | Admitting: Gastroenterology

## 2022-07-09 ENCOUNTER — Other Ambulatory Visit: Payer: Self-pay | Admitting: Family Medicine

## 2022-07-09 DIAGNOSIS — K219 Gastro-esophageal reflux disease without esophagitis: Secondary | ICD-10-CM

## 2022-07-10 NOTE — Telephone Encounter (Signed)
Requested Prescriptions  Pending Prescriptions Disp Refills   omeprazole (PRILOSEC) 10 MG capsule [Pharmacy Med Name: OMEPRAZOLE DR 10 MG CAPSULE] 90 capsule 0    Sig: TAKE 1 CAPSULE BY MOUTH EVERY DAY     Gastroenterology: Proton Pump Inhibitors Passed - 07/09/2022 10:57 AM      Passed - Valid encounter within last 12 months    Recent Outpatient Visits           2 months ago Bilateral impacted cerumen   [place] [facility] In[facility], MD   3 months ago Neuropathic pain of finger, right   [facility]on, Ocie Bob, MD   3 months ago Essential hypertension   [place] [facility] Inches, MD   9 months ago Essential hypertension   [place] [facility] Inches, MD   1 year ago Essential hypertension   [place] [facility] Inches, MD       Future Appointments             In 2 months Duanne Limerick, MD [facility] at [facility], [facility]

## 2022-09-19 ENCOUNTER — Ambulatory Visit: Payer: Medicare PPO | Admitting: Family Medicine

## 2022-09-19 ENCOUNTER — Encounter: Payer: Self-pay | Admitting: Family Medicine

## 2022-09-19 VITALS — BP 120/62 | HR 88 | Ht <= 58 in | Wt 129.0 lb

## 2022-09-19 DIAGNOSIS — J4520 Mild intermittent asthma, uncomplicated: Secondary | ICD-10-CM

## 2022-09-19 DIAGNOSIS — K219 Gastro-esophageal reflux disease without esophagitis: Secondary | ICD-10-CM

## 2022-09-19 DIAGNOSIS — E7849 Other hyperlipidemia: Secondary | ICD-10-CM

## 2022-09-19 DIAGNOSIS — J301 Allergic rhinitis due to pollen: Secondary | ICD-10-CM | POA: Diagnosis not present

## 2022-09-19 DIAGNOSIS — I10 Essential (primary) hypertension: Secondary | ICD-10-CM | POA: Diagnosis not present

## 2022-09-19 MED ORDER — SIMVASTATIN 20 MG PO TABS: 20.0000 mg | ORAL_TABLET | Freq: Every day | ORAL | 1 refills | Status: DC

## 2022-09-19 MED ORDER — LOSARTAN POTASSIUM-HCTZ 100-25 MG PO TABS
1.0000 | ORAL_TABLET | Freq: Every day | ORAL | 1 refills | Status: DC
Start: 2022-09-19 — End: 2023-03-21

## 2022-09-19 MED ORDER — OMEPRAZOLE 10 MG PO CPDR: 10.0000 mg | DELAYED_RELEASE_CAPSULE | Freq: Every day | ORAL | 1 refills | Status: DC

## 2022-09-19 MED ORDER — FLUTICASONE PROPIONATE 50 MCG/ACT NA SUSP: NASAL | 1 refills | Status: DC

## 2022-09-19 MED ORDER — FEXOFENADINE HCL 180 MG PO TABS: 180.0000 mg | ORAL_TABLET | Freq: Every day | ORAL | 1 refills | Status: DC

## 2022-09-19 MED ORDER — MONTELUKAST SODIUM 10 MG PO TABS: ORAL_TABLET | ORAL | 1 refills | Status: DC

## 2022-09-19 NOTE — Progress Notes (Signed)
Date:  09/19/2022   Name:  Kristin Mata   DOB:  January 24, 1951   MRN:  [id number]   Chief Complaint: Allergic Rhinitis , Gastroesophageal Reflux, Hyperlipidemia, and Hypertension  Gastroesophageal Reflux She reports no abdominal pain, no belching, no chest pain, no choking, no coughing, no dysphagia, no early satiety, no heartburn, no hoarse voice, no nausea, no sore throat or no wheezing. This is a chronic problem. The current episode started more than 1 year ago. The problem has been gradually improving. The symptoms are aggravated by certain foods. Pertinent negatives include no anemia, fatigue or melena. She has tried a PPI for the symptoms. The treatment provided moderate relief.  Hyperlipidemia This is a chronic problem. The current episode started more than 1 year ago. The problem is controlled. Recent lipid tests were reviewed and are normal. She has no history of chronic renal disease, diabetes, hypothyroidism, liver disease or obesity. Pertinent negatives include no chest pain or shortness of breath. Current antihyperlipidemic treatment includes statins. The current treatment provides moderate improvement of lipids. There are no compliance problems.  There are no known risk factors for coronary artery disease.  Hypertension This is a chronic problem. The current episode started more than 1 year ago. The problem has been gradually improving since onset. The problem is controlled. Pertinent negatives include no blurred vision, chest pain, headaches, palpitations, peripheral edema or shortness of breath. Past treatments include angiotensin blockers and diuretics. The current treatment provides moderate improvement. There are no compliance problems.  There is no history of angina, CAD/MI or CVA. There is no history of chronic renal disease, a hypertension causing med or renovascular disease.    Lab Results  Component Value Date   NA 139 03/21/2022   K 4.5 03/21/2022   CO2 25 03/21/2022    GLUCOSE 90 03/21/2022   BUN 8 03/21/2022   CREATININE 0.63 03/21/2022   CALCIUM 9.7 03/21/2022   EGFR 96 03/21/2022   GFRNONAA 97 09/30/2019   Lab Results  Component Value Date   CHOL 200 (H) 03/21/2022   HDL 53 03/21/2022   LDLCALC 109 (H) 03/21/2022   TRIG 220 (H) 03/21/2022   CHOLHDL 3.1 08/15/2017   No results found for: "TSH" No results found for: "HGBA1C" Lab Results  Component Value Date   WBC 8.4 03/11/2013   HGB 8.2 (L) 03/12/2013   HCT 21.5 (L) 03/11/2013   MCV 85 03/11/2013   PLT 395 03/11/2013   Lab Results  Component Value Date   ALT 29 03/21/2022   AST 25 03/21/2022   ALKPHOS 73 03/21/2022   BILITOT 0.4 03/21/2022   No results found for: "25OHVITD2", "25OHVITD3", "VD25OH"   Review of Systems  Constitutional:  Negative for fatigue.  HENT:  Negative for hoarse voice and sore throat.   Eyes:  Negative for blurred vision.  Respiratory:  Negative for cough, choking, shortness of breath and wheezing.   Cardiovascular:  Negative for chest pain and palpitations.  Gastrointestinal:  Negative for abdominal pain, dysphagia, heartburn, melena and nausea.  Neurological:  Negative for headaches.    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   GERD (gastroesophageal reflux disease) 04/09/2013   GBS (Guillain Barre syndrome) (HCC) 04/09/2013   DDD (degenerative disc  disease), lumbar 04/09/2013   Colon cancer (HCC) 04/09/2013   Anemia 04/09/2013   Arthritis 03/13/2013    No Known Allergies  Past Surgical History:  Procedure Laterality Date   ABDOMINAL HYSTERECTOMY     BREAST CYST ASPIRATION Bilateral    neg   CATARACT EXTRACTION W/PHACO Right 10/18/2019   Procedure: CATARACT  EXTRACTION PHACO AND INTRAOCULAR LENS PLACEMENT (IOC) RIGHT 2.35  00:26.5;  Surgeon: Nevada Crane, MD;  Location: [facility];  Service: Ophthalmology;  Laterality: Right;   CATARACT EXTRACTION W/PHACO Left 11/15/2019   Procedure: CATARACT EXTRACTION PHACO AND INTRAOCULAR LENS PLACEMENT (IOC) LEFT;  Surgeon: Nevada Crane, MD;  Location: [facility];  Service: Ophthalmology;  Laterality: Left;  1.50 0:19.1   COLON RESECTION     COLONOSCOPY WITH PROPOFOL N/A 06/28/2022   Procedure: COLONOSCOPY WITH PROPOFOL;  Surgeon: Midge Minium, MD;  Location: [facility];  Service: Endoscopy;  Laterality: N/A;   HAMMER TOE SURGERY     HAND SURGERY     HAND SURGERY     Thumb   POLYPECTOMY  06/28/2022   Procedure: POLYPECTOMY;  Surgeon: Midge Minium, MD;  Location: [facility];  Service: Endoscopy;;   ROTATOR CUFF REPAIR Right     Social History   Tobacco Use   Smoking status: Never   Smokeless tobacco: Never  Vaping Use   Vaping status: Never Used  Substance Use Topics   Alcohol use: Yes    Alcohol/week: 1.0 standard drink of alcohol    Types: 1 Standard drinks or equivalent per week   Drug use: No     Medication list has been reviewed and updated.  Current Meds  Medication Sig   albuterol (PROVENTIL HFA;VENTOLIN HFA) 108 (90 Base) MCG/ACT inhaler Fleming   ascorbic acid (VITAMIN C) 250 MG tablet Take 1 tablet by mouth daily.   aspirin EC 81 MG tablet Take 81 mg by mouth daily.   Biotin 10 MG TABS Take 1 tablet by mouth daily.   Cholecalciferol 25 MCG (1000 UT) tablet Take 1 tablet by mouth daily.   ferrous sulfate 325 (65 FE) MG tablet Take 325 mg by mouth daily with breakfast.   fexofenadine (ALLEGRA) 180 MG tablet Take 1 tablet (180 mg total) by mouth daily.   fluticasone (FLONASE) 50 MCG/ACT nasal spray ONE PUFF EACH NOSTRIL EVERY DAY   fluticasone-salmeterol (ADVAIR) 100-50 MCG/ACT AEPB Inhale into the lungs. Inhale 1 inhalation into the lungs  every 12 (twelve) hours PATIENT NEEDS APPOINTMENT FOR ANY ADDITIONAL REFILLS   gabapentin (NEURONTIN) 300 MG capsule Take 1 capsule by mouth at bedtime. Pt taking 1 capsules QHS   losartan-hydrochlorothiazide (HYZAAR) 100-25 MG tablet TAKE 1 TABLET BY MOUTH EVERY DAY   magnesium 30 MG tablet Take 30 mg by mouth daily.   meloxicam (MOBIC) 15 MG tablet TAKE 1 TABLET (15 MG TOTAL) BY MOUTH DAILY.   montelukast (SINGULAIR) 10 MG tablet TAKE 1 TABLET BY MOUTH EVERY DAY   Multiple Vitamin (MULTIVITAMIN) tablet Take 1 tablet by mouth daily.   Omega-3 Fatty Acids (FISH OIL) 1000 MG CAPS Take 1 capsule by mouth 2 (two) times daily.   omeprazole (PRILOSEC) 10 MG capsule TAKE 1 CAPSULE BY MOUTH EVERY DAY   simvastatin (ZOCOR) 20 MG tablet TAKE 1 TABLET BY MOUTH EVERY DAY   vitamin E 1000 UNIT capsule Take 1 capsule by mouth daily.       09/19/2022    9:48 AM 04/23/2022    3:27 PM 03/21/2022  9:56 AM 09/18/2021    9:03 AM  GAD 7 : Generalized Anxiety Score  Nervous, Anxious, on Edge 0 0 0 1  Control/stop worrying 0 0 0 1  Worry too much - different things 0 0 0 1  Trouble relaxing 0 0 0 1  Restless 0 0 0 1  Easily annoyed or irritable 0 0 0 1  Afraid - awful might happen 0 0 0 0  Total GAD 7 Score 0 0 0 6  Anxiety Difficulty Not difficult at all Not difficult at all Not difficult at all Not difficult at all       09/19/2022    9:47 AM 06/19/2022   11:07 AM 04/23/2022    3:27 PM  Depression screen PHQ 2/9  Decreased Interest 0 0 0  Down, Depressed, Hopeless 0 0 0  PHQ - 2 Score 0 0 0  Altered sleeping 0 0 0  Tired, decreased energy 0 0 0  Change in appetite 0 0 0  Feeling bad or failure about yourself  0 0 0  Trouble concentrating 0 0 0  Moving slowly or fidgety/restless 0 0 0  Suicidal thoughts 0 0 0  PHQ-9 Score 0 0 0  Difficult doing work/chores Not difficult at all Not difficult at all Not difficult at all    BP Readings from Last 3 Encounters:  09/19/22 120/62  06/28/22 118/73   04/23/22 120/80    Physical Exam Vitals and nursing note reviewed. Exam conducted with a chaperone present.  Constitutional:      General: She is not in acute distress.    Appearance: She is not diaphoretic.  HENT:     Head: Normocephalic and atraumatic.     Right Ear: Tympanic membrane, ear canal and external ear normal. There is no impacted cerumen.     Left Ear: Tympanic membrane, ear canal and external ear normal. There is no impacted cerumen.     Nose: Nose normal. No congestion or rhinorrhea.     Mouth/Throat:     Mouth: Mucous membranes are moist.     Pharynx: No oropharyngeal exudate or posterior oropharyngeal erythema.  Eyes:     General:        Right eye: No discharge.        Left eye: No discharge.     Conjunctiva/sclera: Conjunctivae normal.     Pupils: Pupils are equal, round, and reactive to light.  Neck:     Thyroid: No thyromegaly.     Vascular: No JVD.  Cardiovascular:     Rate and Rhythm: Normal rate and regular rhythm.     Heart sounds: Normal heart sounds. No murmur heard.    No friction rub. No gallop.  Pulmonary:     Effort: Pulmonary effort is normal.     Breath sounds: Normal breath sounds. No wheezing, rhonchi or rales.  Abdominal:     General: Bowel sounds are normal.     Palpations: Abdomen is soft. There is no mass.     Tenderness: There is no abdominal tenderness. There is no guarding.  Musculoskeletal:        General: Normal range of motion.     Cervical back: Normal range of motion and neck supple.  Lymphadenopathy:     Cervical: No cervical adenopathy.  Skin:    General: Skin is warm and dry.  Neurological:     Mental Status: She is alert.     Deep Tendon Reflexes: Reflexes are normal and symmetric.  Wt Readings from Last 3 Encounters:  09/19/22 129 lb (58.5 kg)  06/28/22 129 lb 9.6 oz (58.8 kg)  06/19/22 135 lb (61.2 kg)    BP 120/62   Pulse 88   Ht 4\' 10"  (1.473 m)   Wt 129 lb (58.5 kg)   SpO2 96%   BMI 26.96 kg/m    Assessment and Plan: 1. Essential hypertension Chronic.  Controlled.  Stable.  Blood pressure 120/62.  Asymptomatic.  Tolerating medications well.  Continue losartan hydrochlorothiazide 100-25 mg once a day.  Will check renal function panel for electrolytes and GFR.  Will recheck in 6 months. - losartan-hydrochlorothiazide (HYZAAR) 100-25 MG tablet; Take 1 tablet by mouth daily.  Dispense: 90 tablet; Refill: 1 - Renal Function Panel  2. Seasonal allergic rhinitis due to pollen Chronic.  Episodic.  Seasonal.  Controlled during the current ragweed season with Allegra 180 mg once a day, Flonase 1 spray every day each nostril and Singulair 10 mg once a day. - fexofenadine (ALLEGRA) 180 MG tablet; Take 1 tablet (180 mg total) by mouth daily.  Dispense: 90 tablet; Refill: 1 - fluticasone (FLONASE) 50 MCG/ACT nasal spray; ONE PUFF EACH NOSTRIL EVERY DAY  Dispense: 48 mL; Refill: 1 - montelukast (SINGULAIR) 10 MG tablet; TAKE 1 TABLET BY MOUTH EVERY DAY  Dispense: 90 tablet; Refill: 1  3. Mild intermittent asthma without complication Chronic.  Intermittent.  Mild.  Currently stable on Singulair 10 mg once a day. - montelukast (SINGULAIR) 10 MG tablet; TAKE 1 TABLET BY MOUTH EVERY DAY  Dispense: 90 tablet; Refill: 1  4. Gastroesophageal reflux disease without esophagitis .  Controlled.  Stable.  Continue omeprazole 20 mg once a day. - omeprazole (PRILOSEC) 10 MG capsule; Take 1 capsule (10 mg total) by mouth daily.  Dispense: 90 capsule; Refill: 1  5. Familial multiple lipoprotein-type hyperlipidemia Chronic.  Controlled.  Stable.  Asymptomatic.  Tolerating medication well.  Will continue simvastatin 20 mg once a day.  Will check lipid panel for current level of LDL control. - simvastatin (ZOCOR) 20 MG tablet; Take 1 tablet (20 mg total) by mouth daily.  Dispense: 90 tablet; Refill: 1 - Lipid Panel With LDL/HDL Ratio     Elizabeth Sauer, MD

## 2022-09-20 ENCOUNTER — Encounter: Payer: Self-pay | Admitting: Family Medicine

## 2022-09-20 LAB — LIPID PANEL WITH LDL/HDL RATIO
Cholesterol, Total: 170 mg/dL (ref 100–199)
HDL: 57 mg/dL (ref >39)
LDL Chol Calc (NIH): 91 mg/dL (ref 0–99)
LDL/HDL Ratio: 1.6 ratio (ref 0.0–3.2)
Triglycerides: 126 mg/dL (ref 0–149)
VLDL Cholesterol Cal: 22 mg/dL (ref 5–40)

## 2022-09-20 LAB — RENAL FUNCTION PANEL
Albumin: 4.4 g/dL (ref 3.9–4.9)
BUN/Creatinine Ratio: 15 (ref 12–28)
BUN: 10 mg/dL (ref 8–27)
CO2: 25 mmol/L (ref 20–29)
Calcium: 9.8 mg/dL (ref 8.7–10.3)
Chloride: 101 mmol/L (ref 96–106)
Creatinine, Ser: 0.66 mg/dL (ref 0.57–1.00)
Glucose: 100 mg/dL — ABNORMAL HIGH (ref 70–99)
Phosphorus: 3.7 mg/dL (ref 3.0–4.3)
Potassium: 4 mmol/L (ref 3.5–5.2)
Sodium: 139 mmol/L (ref 134–144)
eGFR: 95 mL/min/{1.73_m2} (ref >59)

## 2022-10-03 ENCOUNTER — Other Ambulatory Visit: Payer: Self-pay | Admitting: Family Medicine

## 2022-10-03 DIAGNOSIS — M1990 Unspecified osteoarthritis, unspecified site: Secondary | ICD-10-CM

## 2022-10-27 IMAGING — MG DIGITAL DIAGNOSTIC BILAT W/ TOMO W/ CAD
8 series · 8 of 24 positions shown · non-contrast
Comparison: Previous exam(s).

CLINICAL DATA: Clinically appreciated palpable area at the LEFT
breast at 2 o'clock per order requisition.

EXAM:
DIGITAL DIAGNOSTIC BILATERAL MAMMOGRAM WITH TOMOSYNTHESIS AND CAD;
ULTRASOUND LEFT BREAST LIMITED
TECHNIQUE: Bilateral digital diagnostic mammography and breast tomosynthesis
was performed. The images were evaluated with computer-aided
detection.; Targeted ultrasound examination of the left breast was
performed.

[R MLO synth-2D]
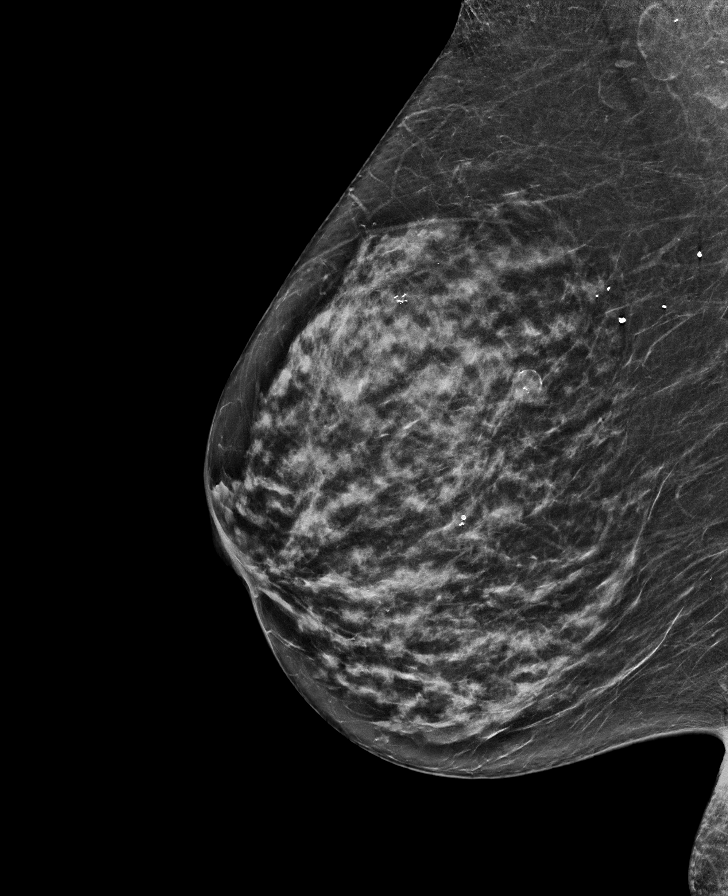

[L CC synth-2D]
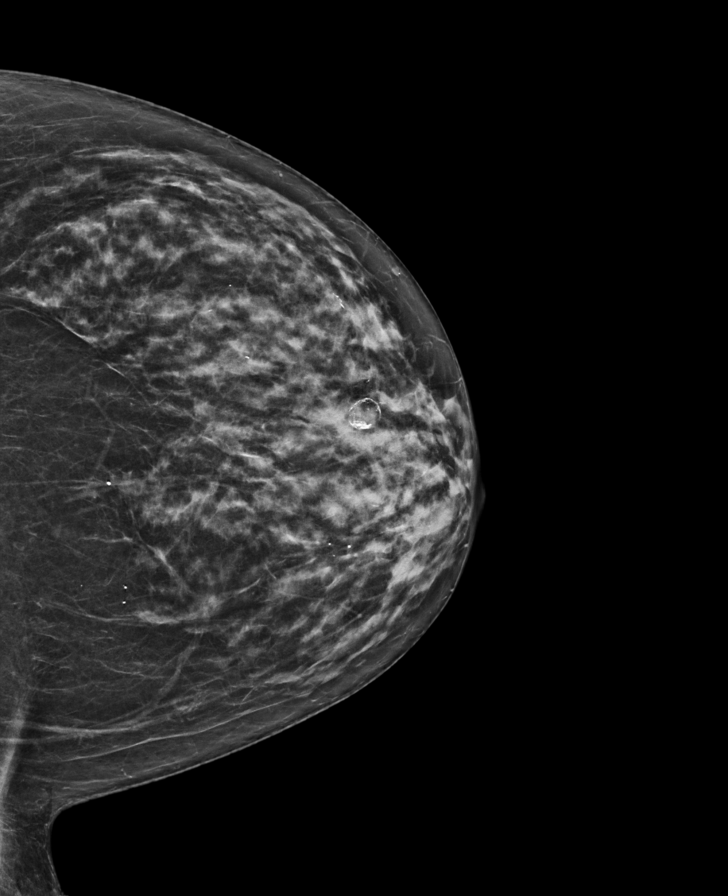

[R CC synth-2D]
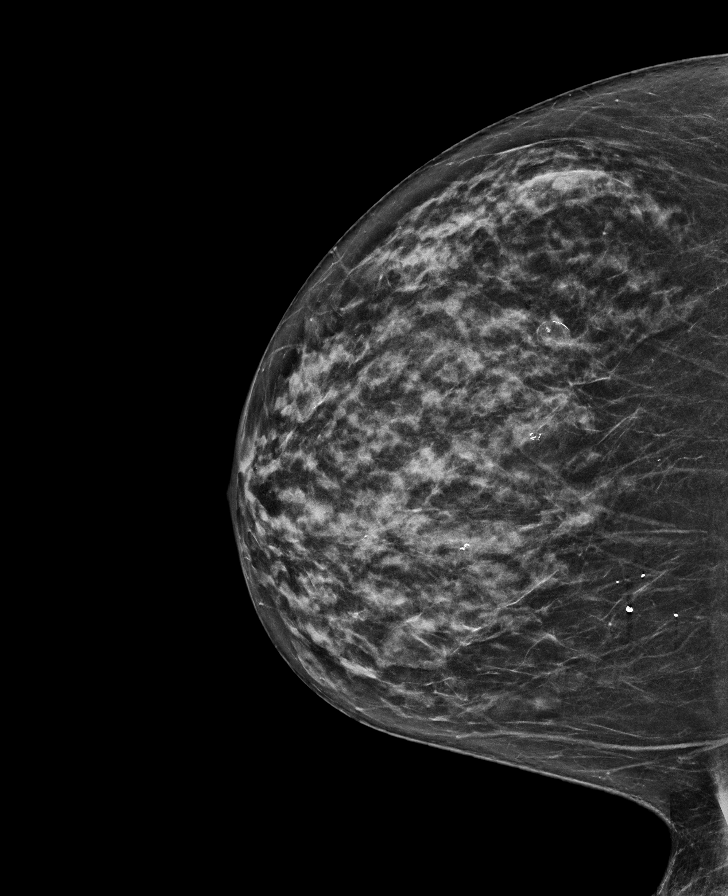

[L MLO synth-2D]
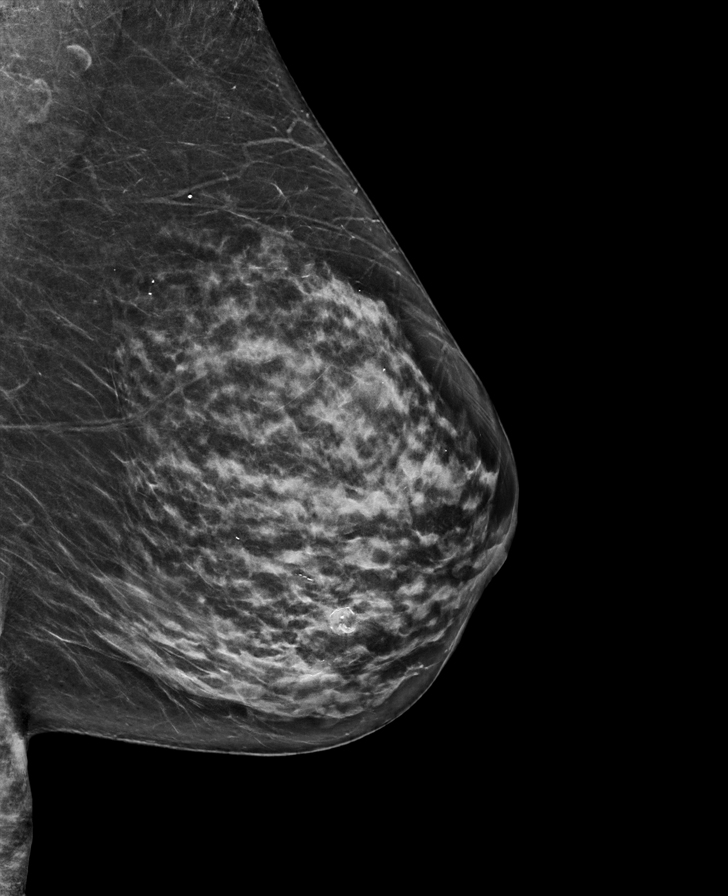

[L MLO tomo · tomo slice 35/70.0]
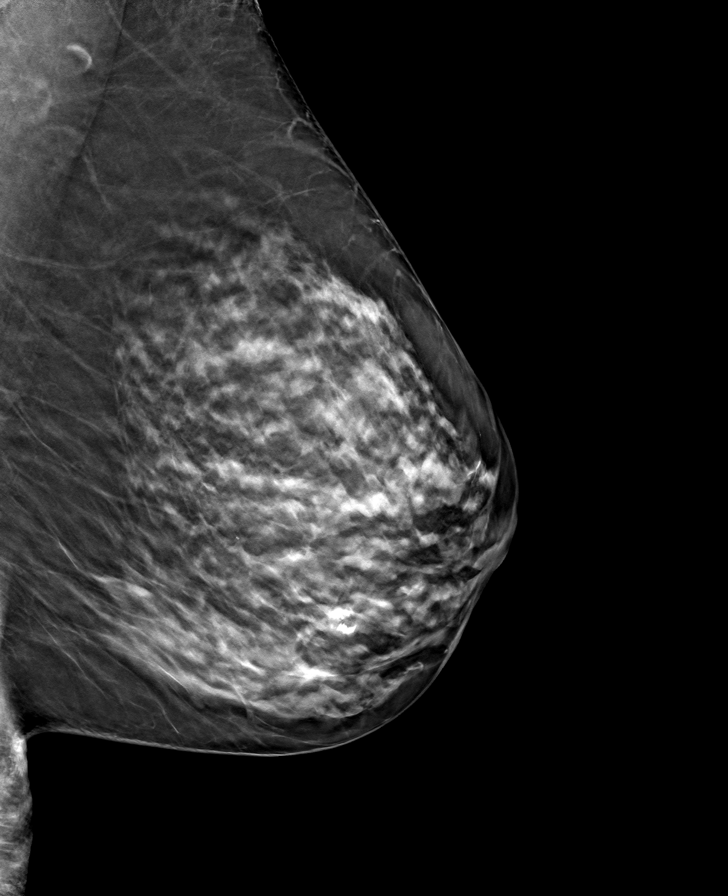

[L CC tomo · tomo slice 33/65.0]
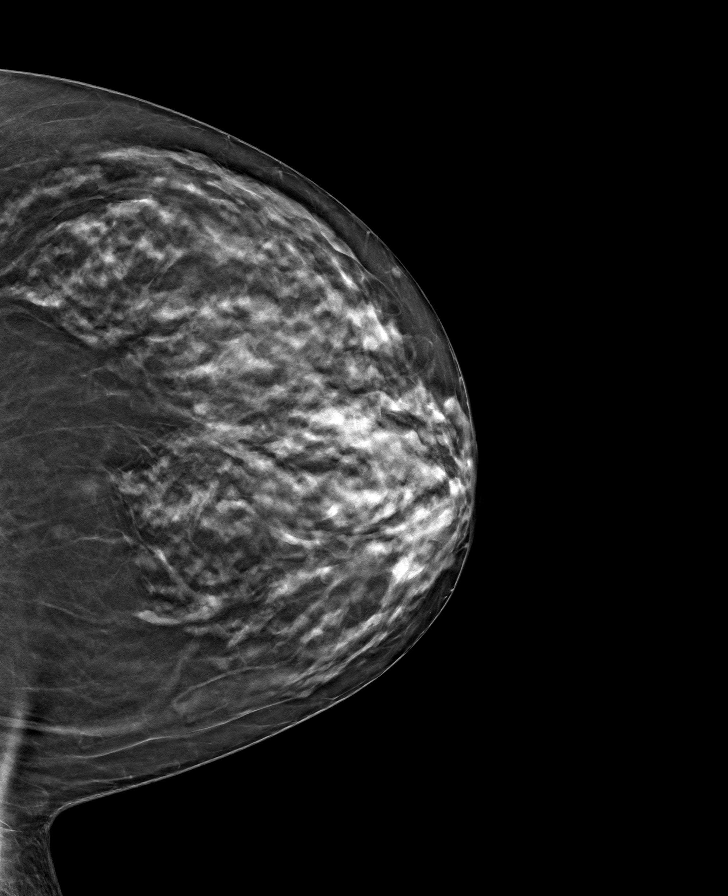

[R CC tomo · tomo slice 33/65.0]
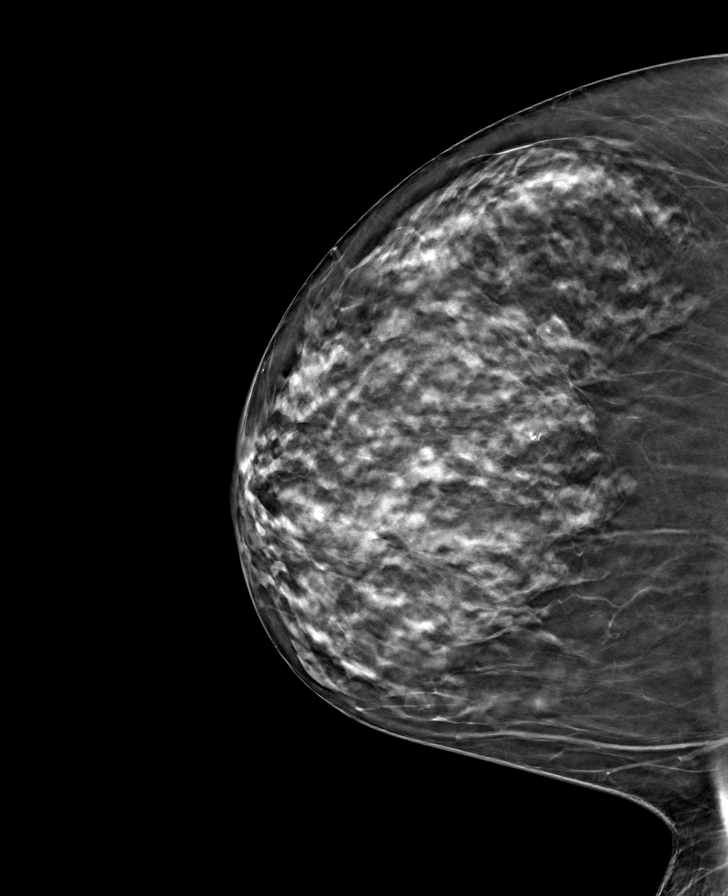

[R MLO tomo · tomo slice 34/67.0]
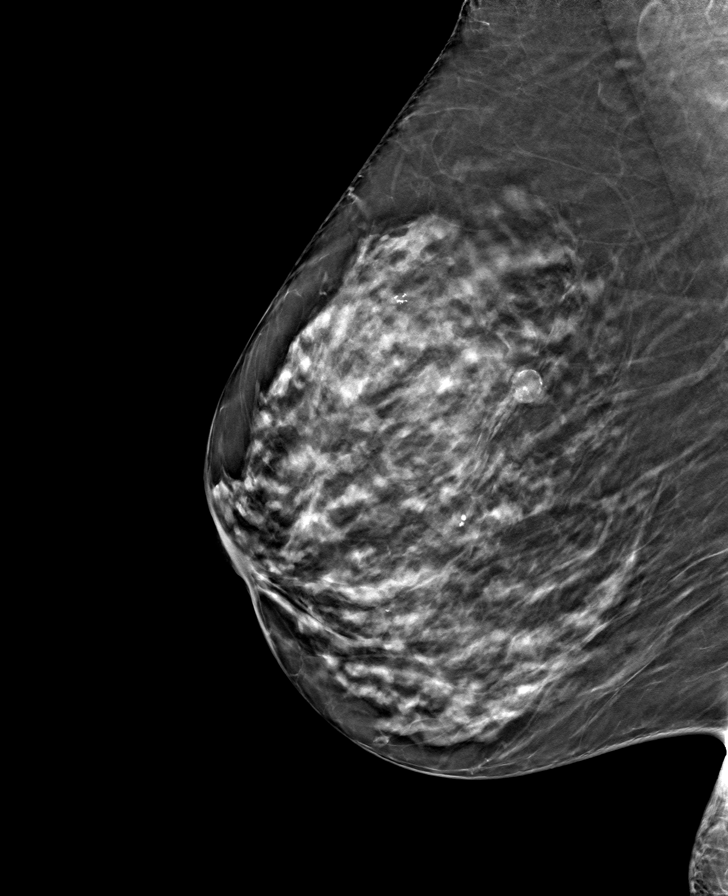

[8 of 24 positions shown; findings below may reference images not displayed]

ACR Breast Density Category c: The breast tissue is heterogeneously
dense, which may obscure small masses.
FINDINGS: Diagnostic images were obtained of the site of palpable concern in
the LEFT breast. No suspicious findings are noted in the LEFT outer
breast. There is a mammographically stable appearance of a rim
calcified mass in the LEFT outer breast at anterior depth since at
least 9651, consistent with a benign etiology. No suspicious mass,
distortion, or microcalcifications are identified to suggest
presence of malignancy.

On physical exam, no suspicious mass is appreciated. Patient
indicates that the site of palpable concern is in the LEFT outer to
lower outer breast near the nipple.

Targeted ultrasound was performed of the LEFT outer breast. No
suspicious cystic or solid mass is seen at 2 o'clock. At 3 o'clock 3
cm from the nipple, there is an oval circumscribed hypoechoic mass
with an echogenic rind which measures 10 x 8 by 11 mm. This
corresponds to the stable benign rim calcified mass noted
mammographically and may reflect a fibroadenoma versus complicated
cyst. At 4 o'clock 4 cm from the nipple, there is an oval
circumscribed anechoic mass with posterior acoustic enhancement. It
measures 5 x 5 x 6 mm and is consistent with a benign simple cyst.
IMPRESSION: 1. No mammographic or sonographic evidence of malignancy at the site
of clinical palpable concern in the LEFT outer breast. There is a
mammographically stable 1 cm mass in the LEFT outer breast at 3
o'clock 3 cm from the nipple, consistent with a benign fibroadenoma
versus rim calcified cyst. It is unclear whether this corresponds to
the clinically appreciated site of palpable concern. Any further
workup of the patient's symptoms should be based on the clinical
assessment. Recommend routine annual screening mammogram in 1 year.
2. No mammographic evidence of malignancy bilaterally.

RECOMMENDATION:
Screening mammogram in one year.(Code:LY-4-IIW)

I have discussed the findings and recommendations with the patient.
If applicable, a reminder letter will be sent to the patient
regarding the next appointment.

BI-RADS CATEGORY  2: Benign.

## 2022-11-20 ENCOUNTER — Other Ambulatory Visit: Payer: Self-pay | Admitting: Family Medicine

## 2022-11-20 DIAGNOSIS — M1990 Unspecified osteoarthritis, unspecified site: Secondary | ICD-10-CM

## 2023-01-14 ENCOUNTER — Other Ambulatory Visit: Payer: Self-pay | Admitting: Family Medicine

## 2023-01-14 DIAGNOSIS — M1990 Unspecified osteoarthritis, unspecified site: Secondary | ICD-10-CM

## 2023-01-15 MED ORDER — MELOXICAM 15 MG PO TABS: 15.0000 mg | ORAL_TABLET | Freq: Every day | ORAL | 0 refills | Status: DC

## 2023-01-30 ENCOUNTER — Other Ambulatory Visit: Payer: Self-pay | Admitting: Family Medicine

## 2023-01-30 DIAGNOSIS — Z1231 Encounter for screening mammogram for malignant neoplasm of breast: Secondary | ICD-10-CM

## 2023-02-27 ENCOUNTER — Ambulatory Visit
Admission: RE | Admit: 2023-02-27 | Discharge: 2023-02-27 | Disposition: A | Discharge status: Home / Self Care | Payer: Medicare PPO | Source: Ambulatory Visit | Attending: Family Medicine | Admitting: Family Medicine

## 2023-02-27 DIAGNOSIS — Z1231 Encounter for screening mammogram for malignant neoplasm of breast: Secondary | ICD-10-CM | POA: Diagnosis not present

## 2023-03-21 ENCOUNTER — Ambulatory Visit: Payer: Medicare PPO | Admitting: Family Medicine

## 2023-03-21 ENCOUNTER — Encounter: Payer: Self-pay | Admitting: Family Medicine

## 2023-03-21 VITALS — BP 138/80 | HR 93 | Ht <= 58 in | Wt 136.0 lb

## 2023-03-21 DIAGNOSIS — I10 Essential (primary) hypertension: Secondary | ICD-10-CM

## 2023-03-21 DIAGNOSIS — K219 Gastro-esophageal reflux disease without esophagitis: Secondary | ICD-10-CM | POA: Diagnosis not present

## 2023-03-21 DIAGNOSIS — M25542 Pain in joints of left hand: Secondary | ICD-10-CM

## 2023-03-21 DIAGNOSIS — M25541 Pain in joints of right hand: Secondary | ICD-10-CM

## 2023-03-21 DIAGNOSIS — E7849 Other hyperlipidemia: Secondary | ICD-10-CM

## 2023-03-21 DIAGNOSIS — J301 Allergic rhinitis due to pollen: Secondary | ICD-10-CM | POA: Diagnosis not present

## 2023-03-21 DIAGNOSIS — M1990 Unspecified osteoarthritis, unspecified site: Secondary | ICD-10-CM | POA: Diagnosis not present

## 2023-03-21 DIAGNOSIS — J4520 Mild intermittent asthma, uncomplicated: Secondary | ICD-10-CM

## 2023-03-21 MED ORDER — MELOXICAM 15 MG PO TABS
15.0000 mg | ORAL_TABLET | Freq: Every day | ORAL | 0 refills | Status: AC
Start: 2023-03-21 — End: ?

## 2023-03-21 MED ORDER — OMEPRAZOLE 10 MG PO CPDR
10.0000 mg | DELAYED_RELEASE_CAPSULE | Freq: Every day | ORAL | 1 refills | Status: AC
Start: 2023-03-21 — End: ?

## 2023-03-21 MED ORDER — MONTELUKAST SODIUM 10 MG PO TABS
ORAL_TABLET | ORAL | 1 refills | Status: AC
Start: 2023-03-21 — End: ?

## 2023-03-21 MED ORDER — LOSARTAN POTASSIUM-HCTZ 100-25 MG PO TABS
1.0000 | ORAL_TABLET | Freq: Every day | ORAL | 1 refills | Status: AC
Start: 2023-03-21 — End: ?

## 2023-03-21 MED ORDER — SIMVASTATIN 20 MG PO TABS
20.0000 mg | ORAL_TABLET | Freq: Every day | ORAL | 1 refills | Status: AC
Start: 2023-03-21 — End: ?

## 2023-03-21 MED ORDER — FEXOFENADINE HCL 180 MG PO TABS
180.0000 mg | ORAL_TABLET | Freq: Every day | ORAL | 1 refills | Status: AC
Start: 2023-03-21 — End: ?

## 2023-03-21 MED ORDER — FLUTICASONE PROPIONATE 50 MCG/ACT NA SUSP
NASAL | 1 refills | Status: AC
Start: 2023-03-21 — End: ?

## 2023-03-21 NOTE — Progress Notes (Signed)
 Date:  03/21/2023   Name:  Kristin Mata   DOB:  04/11/1950   MRN:  [id number]   Chief Complaint: Hypertension, Gastroesophageal Reflux, Hyperlipidemia, and Asthma  Hypertension This is a chronic problem. The current episode started more than 1 year ago. The problem has been gradually improving since onset. The problem is controlled. Pertinent negatives include no anxiety, blurred vision, chest pain, headaches, malaise/fatigue, neck pain, orthopnea, palpitations, peripheral edema, PND, shortness of breath or sweats. There are no associated agents to hypertension. Risk factors for coronary artery disease include dyslipidemia. Past treatments include angiotensin blockers and diuretics. The current treatment provides moderate improvement. There are no compliance problems.   Gastroesophageal Reflux She complains of a hoarse voice. She reports no abdominal pain, no belching, no chest pain, no choking, no coughing, no heartburn, no nausea or no wheezing. This is a chronic problem. The current episode started in the past 7 days. The problem has been gradually improving. The symptoms are aggravated by certain foods. Pertinent negatives include no fatigue. The treatment provided moderate relief.  Hyperlipidemia This is a chronic problem. The current episode started more than 1 year ago. The problem is controlled. She has no history of diabetes. Pertinent negatives include no chest pain or shortness of breath. Current antihyperlipidemic treatment includes statins. The current treatment provides moderate improvement of lipids. There are no compliance problems.  Risk factors for coronary artery disease include dyslipidemia and hypertension.  Asthma She complains of hoarse voice. There is no cough, shortness of breath or wheezing. This is a chronic problem. The problem occurs intermittently. The problem has been gradually improving. Pertinent negatives include no chest pain, headaches, heartburn,  malaise/fatigue, PND or sweats. Her symptoms are alleviated by diuretics. She reports moderate improvement on treatment. Her past medical history is significant for asthma.    Lab Results  Component Value Date   NA 139 09/19/2022   K 4.0 09/19/2022   CO2 25 09/19/2022   GLUCOSE 100 (H) 09/19/2022   BUN 10 09/19/2022   CREATININE 0.66 09/19/2022   CALCIUM 9.8 09/19/2022   EGFR 95 09/19/2022   GFRNONAA 97 09/30/2019   Lab Results  Component Value Date   CHOL 170 09/19/2022   HDL 57 09/19/2022   LDLCALC 91 09/19/2022   TRIG 126 09/19/2022   CHOLHDL 3.1 08/15/2017   No results found for: "TSH" No results found for: "HGBA1C" Lab Results  Component Value Date   WBC 8.4 03/11/2013   HGB 8.2 (L) 03/12/2013   HCT 21.5 (L) 03/11/2013   MCV 85 03/11/2013   PLT 395 03/11/2013   Lab Results  Component Value Date   ALT 29 03/21/2022   AST 25 03/21/2022   ALKPHOS 73 03/21/2022   BILITOT 0.4 03/21/2022   No results found for: "25OHVITD2", "25OHVITD3", "VD25OH"   Review of Systems  Constitutional:  Negative for fatigue, malaise/fatigue and unexpected weight change.  HENT:  Positive for hoarse voice. Negative for congestion.   Eyes:  Negative for blurred vision and visual disturbance.  Respiratory:  Negative for cough, choking, chest tightness, shortness of breath and wheezing.   Cardiovascular:  Negative for chest pain, palpitations, orthopnea and PND.  Gastrointestinal:  Negative for abdominal distention, abdominal pain, blood in stool, heartburn and nausea.  Musculoskeletal:  Negative for neck pain.  Neurological:  Negative for headaches.    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   GERD  (gastroesophageal reflux disease) 04/09/2013   GBS (Guillain Barre syndrome) (HCC) 04/09/2013   DDD (degenerative disc disease), lumbar 04/09/2013   Colon cancer (HCC) 04/09/2013   Anemia 04/09/2013   Arthritis 03/13/2013    No Known Allergies  Past Surgical History:  Procedure Laterality Date   ABDOMINAL HYSTERECTOMY     BREAST CYST ASPIRATION Bilateral    neg   CATARACT EXTRACTION W/PHACO Right 10/18/2019   Procedure: CATARACT EXTRACTION PHACO AND INTRAOCULAR LENS PLACEMENT (IOC) RIGHT 2.35  00:26.5;  Surgeon: Nevada Crane, MD;  Location: [facility];  Service: Ophthalmology;  Laterality: Right;   CATARACT EXTRACTION W/PHACO Left 11/15/2019   Procedure: CATARACT EXTRACTION PHACO AND INTRAOCULAR LENS PLACEMENT (IOC) LEFT;  Surgeon: Nevada Crane, MD;  Location: [facility];  Service: Ophthalmology;  Laterality: Left;  1.50 0:19.1   COLON RESECTION     COLONOSCOPY WITH PROPOFOL N/A 06/28/2022   Procedure: COLONOSCOPY WITH PROPOFOL;  Surgeon: Midge Minium, MD;  Location: [facility];  Service: Endoscopy;  Laterality: N/A;   HAMMER TOE SURGERY     HAND SURGERY     HAND SURGERY     Thumb   POLYPECTOMY  06/28/2022   Procedure: POLYPECTOMY;  Surgeon: Midge Minium, MD;  Location: [facility];  Service: Endoscopy;;   ROTATOR CUFF REPAIR Right     Social History   Tobacco Use   Smoking status: Never   Smokeless tobacco: Never  Vaping Use   Vaping status: Never Used  Substance Use Topics   Alcohol use: Yes    Alcohol/week: 1.0 standard drink of alcohol    Types: 1 Standard drinks or equivalent per week   Drug use: No     Medication list has been reviewed and updated.  Current Meds  Medication Sig   albuterol (PROVENTIL HFA;VENTOLIN HFA) 108 (90 Base) MCG/ACT inhaler Fleming   ascorbic acid (VITAMIN C) 250 MG tablet Take 1 tablet by mouth daily.   aspirin EC 81 MG tablet Take 81 mg by mouth daily.   Biotin 10 MG TABS Take 1 tablet by  mouth daily.   Cholecalciferol 25 MCG (1000 UT) tablet Take 1 tablet by mouth daily.   ferrous sulfate 325 (65 FE) MG tablet Take 325 mg by mouth daily with breakfast.   fexofenadine (ALLEGRA) 180 MG tablet Take 1 tablet (180 mg total) by mouth daily.   fluticasone (FLONASE) 50 MCG/ACT nasal spray ONE PUFF EACH NOSTRIL EVERY DAY   fluticasone-salmeterol (ADVAIR) 100-50 MCG/ACT AEPB Inhale into the lungs. Inhale 1 inhalation into the lungs every 12 (twelve) hours PATIENT NEEDS APPOINTMENT FOR ANY ADDITIONAL REFILLS   gabapentin (NEURONTIN) 300 MG capsule Take 1 capsule by mouth at bedtime. Pt taking 1 capsules QHS   losartan-hydrochlorothiazide (HYZAAR) 100-25 MG tablet Take 1 tablet by mouth daily.   magnesium 30 MG tablet Take 30 mg by mouth daily.   meloxicam (MOBIC) 15 MG tablet Take 1 tablet (15 mg total) by mouth daily.   montelukast (SINGULAIR) 10 MG tablet TAKE 1 TABLET BY MOUTH EVERY DAY   Multiple Vitamin (MULTIVITAMIN) tablet Take 1 tablet by mouth daily.   Omega-3 Fatty Acids (FISH OIL) 1000 MG CAPS Take 1 capsule by mouth 2 (two) times daily.   omeprazole (PRILOSEC) 10 MG capsule Take 1 capsule (10 mg total) by mouth daily.  simvastatin (ZOCOR) 20 MG tablet Take 1 tablet (20 mg total) by mouth daily.   vitamin E 1000 UNIT capsule Take 1 capsule by mouth daily.       03/21/2023    1:42 PM 09/19/2022    9:48 AM 04/23/2022    3:27 PM 03/21/2022    9:56 AM  GAD 7 : Generalized Anxiety Score  Nervous, Anxious, on Edge 0 0 0 0  Control/stop worrying 0 0 0 0  Worry too much - different things 0 0 0 0  Trouble relaxing 0 0 0 0  Restless 0 0 0 0  Easily annoyed or irritable 0 0 0 0  Afraid - awful might happen 0 0 0 0  Total GAD 7 Score 0 0 0 0  Anxiety Difficulty Not difficult at all Not difficult at all Not difficult at all Not difficult at all       03/21/2023    1:42 PM 09/19/2022    9:47 AM 06/19/2022   11:07 AM  Depression screen PHQ 2/9  Decreased Interest 0 0 0  Down,  Depressed, Hopeless 0 0 0  PHQ - 2 Score 0 0 0  Altered sleeping 3 0 0  Tired, decreased energy 0 0 0  Change in appetite 0 0 0  Feeling bad or failure about yourself  0 0 0  Trouble concentrating 0 0 0  Moving slowly or fidgety/restless 0 0 0  Suicidal thoughts 0 0 0  PHQ-9 Score 3 0 0  Difficult doing work/chores Not difficult at all Not difficult at all Not difficult at all    BP Readings from Last 3 Encounters:  03/21/23 (!) 142/78  09/19/22 120/62  06/28/22 118/73    Physical Exam Vitals and nursing note reviewed.  Constitutional:      General: She is not in acute distress.    Appearance: She is not diaphoretic.  HENT:     Head: Normocephalic and atraumatic.     Right Ear: Tympanic membrane and external ear normal.     Left Ear: Tympanic membrane and external ear normal.     Nose: Nose normal. No congestion or rhinorrhea.  Eyes:     General:        Right eye: No discharge.        Left eye: No discharge.     Conjunctiva/sclera: Conjunctivae normal.     Pupils: Pupils are equal, round, and reactive to light.  Neck:     Thyroid: No thyromegaly.     Vascular: No JVD.  Cardiovascular:     Rate and Rhythm: Normal rate and regular rhythm.     Heart sounds: Normal heart sounds. No murmur heard.    No friction rub. No gallop.  Pulmonary:     Effort: Pulmonary effort is normal.     Breath sounds: Normal breath sounds.  Abdominal:     General: Bowel sounds are normal.     Palpations: Abdomen is soft. There is no mass.     Tenderness: There is no abdominal tenderness. There is no guarding.  Musculoskeletal:        General: Normal range of motion.     Cervical back: Normal range of motion and neck supple.  Lymphadenopathy:     Cervical: No cervical adenopathy.  Skin:    General: Skin is warm and dry.  Neurological:     Mental Status: She is alert.     Deep Tendon Reflexes: Reflexes are normal and symmetric.  Wt Readings from Last 3 Encounters:  03/21/23 136  lb (61.7 kg)  09/19/22 129 lb (58.5 kg)  06/28/22 129 lb 9.6 oz (58.8 kg)    BP (!) 142/78   Pulse 93   Ht 4\' 10"  (1.473 m)   Wt 136 lb (61.7 kg)   SpO2 95%   BMI 28.42 kg/m   Assessment and Plan:  1. Seasonal allergic rhinitis due to pollen Chronic.  Controlled.  Stable.  Continue fexofenadine 180 mg once a day, fluticasone nasal spray 1 puff each nostril daily and Singulair 10 mg once a day.  Will return on as-needed basis - fexofenadine (ALLEGRA) 180 MG tablet; Take 1 tablet (180 mg total) by mouth daily.  Dispense: 90 tablet; Refill: 1 - fluticasone (FLONASE) 50 MCG/ACT nasal spray; ONE PUFF EACH NOSTRIL EVERY DAY  Dispense: 48 mL; Refill: 1 - montelukast (SINGULAIR) 10 MG tablet; TAKE 1 TABLET BY MOUTH EVERY DAY  Dispense: 90 tablet; Refill: 1  2. Essential hypertension Chronic.  Controlled.  Stable.  Asymptomatic.  Current blood pressure 138/80.  Asymptomatic.  Tolerating medication well.  Will continue losartan hydrochlorothiazide 100-25 mg once a day.  And will check renal function panel for electrolytes and GFR.  Will recheck patient in 6 months. - losartan-hydrochlorothiazide (HYZAAR) 100-25 MG tablet; Take 1 tablet by mouth daily.  Dispense: 90 tablet; Refill: 1 - Renal Function Panel  3. Arthritis Bilateral arthritis noted in both hands will refill meloxicam 15 mg once a day.  Patient has had see Dr. Ernest Pine in the past and have surgery on an MP joint on the I believe right hand.  We will refer to Dr. Ernest Pine to look at in particular the PIP joint of the middle finger on the right hand. - meloxicam (MOBIC) 15 MG tablet; Take 1 tablet (15 mg total) by mouth daily.  Dispense: 90 tablet; Refill: 0  4. Mild intermittent asthma without complication Chronic.  Controlled.  Stable.  Continue montelukast 10 mg once a day. - montelukast (SINGULAIR) 10 MG tablet; TAKE 1 TABLET BY MOUTH EVERY DAY  Dispense: 90 tablet; Refill: 1  5. Gastroesophageal reflux disease without  esophagitis Chronic.  Controlled.  Stable.  Asymptomatic.  Continue omeprazole 10 mg once a day. - omeprazole (PRILOSEC) 10 MG capsule; Take 1 capsule (10 mg total) by mouth daily.  Dispense: 90 capsule; Refill: 1  6. Familial multiple lipoprotein-type hyperlipidemia Chronic.  Controlled.  Stable.  Asymptomatic without myalgias or muscle weakness.  Will continue simvastatin 20 mg once a day. - simvastatin (ZOCOR) 20 MG tablet; Take 1 tablet (20 mg total) by mouth daily.  Dispense: 90 tablet; Refill: 1  7. Arthralgia of both hands (Primary) As noted above patient has arthritis currently is on meloxicam we will continue with as needed Tylenol.  Will refer to Dr. Ernest Pine for further evaluation and determination if any hand joints are going to need to have joint replacement. - Ambulatory referral to Orthopedic Surgery    Elizabeth Sauer, MD

## 2023-03-22 ENCOUNTER — Encounter: Payer: Self-pay | Admitting: Family Medicine

## 2023-03-22 LAB — RENAL FUNCTION PANEL
Albumin: 4.2 g/dL (ref 3.9–4.9)
BUN/Creatinine Ratio: 21 (ref 12–28)
BUN: 12 mg/dL (ref 8–27)
CO2: 26 mmol/L (ref 20–29)
Calcium: 9.6 mg/dL (ref 8.7–10.3)
Chloride: 102 mmol/L (ref 96–106)
Creatinine, Ser: 0.56 mg/dL — ABNORMAL LOW (ref 0.57–1.00)
Glucose: 99 mg/dL (ref 70–99)
Phosphorus: 3.8 mg/dL (ref 3.0–4.3)
Potassium: 4.6 mmol/L (ref 3.5–5.2)
Sodium: 143 mmol/L (ref 134–144)
eGFR: 98 mL/min/{1.73_m2} (ref >59)

## 2023-06-25 ENCOUNTER — Ambulatory Visit: Payer: Medicare PPO

## 2023-07-23 ENCOUNTER — Ambulatory Visit

## 2024-02-09 ENCOUNTER — Other Ambulatory Visit: Payer: Self-pay | Admitting: Internal Medicine

## 2024-02-09 DIAGNOSIS — Z1231 Encounter for screening mammogram for malignant neoplasm of breast: Secondary | ICD-10-CM

## 2024-03-04 ENCOUNTER — Ambulatory Visit

## 2024-03-30 ENCOUNTER — Ambulatory Visit
